# Patient Record
Sex: Male | Born: 1970
Health system: Southern US, Community
[De-identification: ages and names within clinical notes are randomized; demographics above are authoritative.]

## PROBLEM LIST (undated history)

## (undated) DIAGNOSIS — K219 Gastro-esophageal reflux disease without esophagitis: Secondary | ICD-10-CM

## (undated) DIAGNOSIS — K222 Esophageal obstruction: Secondary | ICD-10-CM

## (undated) DIAGNOSIS — G8929 Other chronic pain: Secondary | ICD-10-CM

## (undated) DIAGNOSIS — G43909 Migraine, unspecified, not intractable, without status migrainosus: Secondary | ICD-10-CM

## (undated) DIAGNOSIS — E785 Hyperlipidemia, unspecified: Secondary | ICD-10-CM

## (undated) DIAGNOSIS — M542 Cervicalgia: Secondary | ICD-10-CM

## (undated) DIAGNOSIS — K315 Obstruction of duodenum: Secondary | ICD-10-CM

## (undated) DIAGNOSIS — IMO0002 Reserved for concepts with insufficient information to code with codable children: Secondary | ICD-10-CM

## (undated) DIAGNOSIS — J40 Bronchitis, not specified as acute or chronic: Secondary | ICD-10-CM

## (undated) HISTORY — DX: Obstruction of duodenum: K31.5

## (undated) HISTORY — PX: ESOPHAGOGASTRODUODENOSCOPY: SHX1529

## (undated) HISTORY — DX: Cervicalgia: M54.2

## (undated) HISTORY — DX: Esophageal obstruction: K22.2

## (undated) HISTORY — DX: Gastro-esophageal reflux disease without esophagitis: K21.9

## (undated) HISTORY — DX: Bronchitis, not specified as acute or chronic: J40

## (undated) HISTORY — DX: Hyperlipidemia, unspecified: E78.5

## (undated) HISTORY — DX: Reserved for concepts with insufficient information to code with codable children: IMO0002

## (undated) HISTORY — DX: Other chronic pain: G89.29

## (undated) HISTORY — DX: Migraine, unspecified, not intractable, without status migrainosus: G43.909

---

## 2009-12-21 DIAGNOSIS — K222 Esophageal obstruction: Secondary | ICD-10-CM

## 2009-12-21 DIAGNOSIS — K315 Obstruction of duodenum: Secondary | ICD-10-CM

## 2009-12-21 HISTORY — DX: Obstruction of duodenum: K31.5

## 2009-12-21 HISTORY — DX: Esophageal obstruction: K22.2

## 2010-06-20 DIAGNOSIS — IMO0002 Reserved for concepts with insufficient information to code with codable children: Secondary | ICD-10-CM

## 2010-06-20 HISTORY — DX: Reserved for concepts with insufficient information to code with codable children: IMO0002

## 2010-07-16 ENCOUNTER — Ambulatory Visit (HOSPITAL_COMMUNITY): Admission: EM | Admit: 2010-07-16 | Discharge: 2010-07-16 | Payer: Self-pay | Admitting: Emergency Medicine

## 2010-07-16 ENCOUNTER — Ambulatory Visit: Payer: Self-pay | Admitting: Gastroenterology

## 2010-12-07 ENCOUNTER — Inpatient Hospital Stay (HOSPITAL_COMMUNITY)
Admission: EM | Admit: 2010-12-07 | Discharge: 2010-12-09 | Disposition: A | Discharge status: Still In Hospital | Payer: Self-pay | Source: Home / Self Care | Attending: Internal Medicine | Admitting: Internal Medicine

## 2010-12-07 ENCOUNTER — Encounter (INDEPENDENT_AMBULATORY_CARE_PROVIDER_SITE_OTHER): Payer: Self-pay | Admitting: *Deleted

## 2010-12-08 ENCOUNTER — Encounter: Payer: Self-pay | Admitting: Gastroenterology

## 2010-12-08 ENCOUNTER — Encounter (INDEPENDENT_AMBULATORY_CARE_PROVIDER_SITE_OTHER): Payer: Self-pay | Admitting: *Deleted

## 2010-12-09 ENCOUNTER — Encounter: Payer: Self-pay | Admitting: Gastroenterology

## 2010-12-09 ENCOUNTER — Encounter (INDEPENDENT_AMBULATORY_CARE_PROVIDER_SITE_OTHER): Payer: Self-pay | Admitting: *Deleted

## 2011-01-20 ENCOUNTER — Ambulatory Visit
Admission: RE | Admit: 2011-01-20 | Discharge: 2011-01-20 | Payer: Self-pay | Source: Home / Self Care | Attending: Gastroenterology | Admitting: Gastroenterology

## 2011-01-20 DIAGNOSIS — D62 Acute posthemorrhagic anemia: Secondary | ICD-10-CM | POA: Insufficient documentation

## 2011-01-20 DIAGNOSIS — K269 Duodenal ulcer, unspecified as acute or chronic, without hemorrhage or perforation: Secondary | ICD-10-CM | POA: Insufficient documentation

## 2011-01-20 DIAGNOSIS — K260 Acute duodenal ulcer with hemorrhage: Secondary | ICD-10-CM | POA: Insufficient documentation

## 2011-01-20 NOTE — Procedures (Addendum)
Summary: Upper Endoscopy  Patient: Juan Meyer Note: All result statuses are Final unless otherwise noted.  Tests: (1) Upper Endoscopy (EGD)   EGD Upper Endoscopy       DONE (C)     [facility]     [address]     [place], [place]  [address]           ENDOSCOPY PROCEDURE REPORT           PATIENT:  Juan Meyer, Juan Meyer  MR#:  [id number]     BIRTHDATE:  Nov 13, 1971, 39 yrs. old  GENDER:  male     ENDOSCOPIST:  Judie Petit T. Russella Dar, MD, [facility]     Referred by:  Linwood Dibbles, M.D.     PROCEDURE DATE:  07/16/2010     PROCEDURE:  EGD with foreign body removal, EGD with dilatation     over guidewire, EGD with biopsy     ASA CLASS:  Class I     INDICATIONS:  food impaction, dysphagia, chronic GERD, h/o ulcer     disease     MEDICATIONS:  Fentanyl 100 mcg IV, Versed 12 mg IV, Benadryl 50 mg     IV     TOPICAL ANESTHETIC:  Cetacaine Spray     DESCRIPTION OF PROCEDURE:   After the risks benefits and     alternatives of the procedure were thoroughly explained, informed     consent was obtained.  The EG-2990i ([id number]) endoscope was     introduced through the mouth and advanced to the second portion of     the duodenum, limited by Retained food in the stomach.   The     instrument was slowly withdrawn as the mucosa was fully examined.     <<PROCEDUREIMAGES>>     Retained food was present in the distal esophagus. The food was     removed without difficulty.  A stricture was found at the     gastroesophageal junction. It was circumferential and benign     appearing. It was 14 mm in diameter. Savary / guidewire 15 mm,     16mm and 17mm with minimal resistance to each dilator and no heme     noted.  Moderate gastritis was found in the antrum. It was erosive     and erythematous. A biopsy for H. pylori was taken.  An ulcer was     found in the bulb of the duodenum. It was clean based and friable.     It was 7 mm in size.  A stricture was found in the bulb of the     duodenum. It was 11 mm  in diameter. Retained food in the gastric     body.  Otherwise the examination was normal. Retroflexed views     revealed no abnormalities.  The scope was then withdrawn from the     patient and the procedure completed.     COMPLICATIONS:  None     ENDOSCOPIC IMPRESSION:     1) Food, retained in the distal esophagus     2) 14 mm stricture at the gastroesophageal junction     3) Moderate gastritis in the antrum     4) 7 mm ulcer in the bulb of duodenum     5) 11 mm stricture in the bulb of duodenum     RECOMMENDATIONS:     1) Anti-reflux regimen     2) PPI bid: Prilosec 20mg  OTC one po bid taken 30 minutes  before     breakfast and dinner     3) Await biopsy results     4) Post dilation instructions, DC Goody powders and all     ASA/NSAID products     5) Call office next 2-3 days to schedule an office appointment     for 4-6 week           Ardra Kuznicki T. Russella Dar, MD, [facility]           n.     REVISED:  07/16/2010 10:44 PM     eSIGNED:   Venita Lick. Narcisa Ganesh at 07/16/2010 10:44 PM           [name], [id number]  Note: An exclamation mark (!) indicates a result that was not dispersed into the flowsheet. Document Creation Date: 07/17/2010 9:12 AM _______________________________________________________________________  (1) Order result status: Final Collection or observation date-time: 07/16/2010 22:12 Requested date-time:  Receipt date-time:  Reported date-time:  Referring Physician:   Ordering Physician: Claudette Head [phone]) Specimen Source:  Source: Launa Grill Order Number: [id number] Lab site:   Appended Document: Upper Endoscopy L-Clotest (H. pylori), Biopsy - STATUS: Final                                            Perform Date: 27Jul11 22:31  Ordered By: Aggie Hacker,             Ordered Date: 27Jul11 22:36                                       Last Updated Date: 29Jul11 06:50  Facility: [facility]                              Department: MICR  Accession #:  [id number]                  USN:       [id number]  Findings  Result Name                              Result     Abnl   Normal Range     Units      Perf. Loc.  Helicobacter Screen                      SEE NOTE.         URENEG    UREASE NEGATIVE    CLOTEST DETECTS THE UREASE    ENZYME OF HELICOBACTER    PYLORI IN GASTRIC MUCOSAL    BIOPSIES.  Additional Information  HL7 RESULT STATUS : F  External IF Update Timestamp : 2010-07-18:06:46:00.000000

## 2011-01-22 NOTE — Discharge Summary (Signed)
Summary: Upper gastrointestinal bleed     NAME:  Juan Meyer, Juan Meyer                  ACCOUNT NO.:  [id number]      MEDICAL RECORD NO.:  [id number]          PATIENT TYPE:  INP      LOCATION:  4706                         FACILITY:  [facility]      PHYSICIAN:  Rock Nephew, MD       DATE OF BIRTH:  Mar 30, 1971      DATE OF ADMISSION:  12/07/2010   DATE OF DISCHARGE:  12/09/2010                                  DISCHARGE SUMMARY         PRIMARY CARE PHYSICIAN:  The patient does not have primary care   physician but he is going to establish care with [facility], Dr.   Deloria Lair.      DISCHARGE DIAGNOSES:   1. Upper gastrointestinal bleed secondary to clean based duodenal bulb       ulcer.   2. Hypotension, resolved.   3. Leukocytosis stress reaction.   4. Acute blood loss anemia.      DISCHARGE MEDICATIONS:  For the patient are as follows:   1. Iron 325 mg p.o. daily for 30 days.   2. Omeprazole 20 mg p.o. twice daily.      PROCEDURES PERFORMED:  The patient had an upper endoscopy performed   which showed a clean based ulcer in the bulb of the duodenum.  There   were no biopsies taken.      CONSULTANTS:  Dr. Arlyce Dice, [facility].      DIET:  Regular diet.      FOLLOWUP:  The patient should follow up with, the patient has an   appointment with [facility] PCP Dr. Deloria Lair to follow up December 19, 2010.  The patient also has an appointment to follow up with Dr. Claudette Head on January 31 at 10:00 a.m.  The patient is also instructed to   avoid NSAIDs.      INITIAL HISTORY AND PHYSICAL:  Chief complaint:  Black stools.  Mr. Shimel   is a pleasant 40 year old Caucasian male with a history of stomach ulcer   since he was 40 years old according to him.  The patient had upper   endoscopy in June of 2011 and a piece of meat was stuck in his   esophagus.  He was brought in for dark stools and he was slightly   hypertensive.      Hospital Course:      1. Upper GI bleed.  The patient had  upper GI bleed secondary to       duodenal bulb ulcer.  The patient's hemoglobin remained stable.       Hemoglobin initially was 10.9.  The patient's hemoglobin currently       is 10.0.  The patient did not receive any blood.  He had upper       endoscopy.  He was seen by [facility] and the patient is deemed       ready for discharge on December 09, 2010.  He will be discharged  home on omeprazole 20 mg p.o. b.i.d.   2. Hypotension.  The patient had some episodes of hypotension.  It       could have been related to volume depletion and blood loss.       However, the patient's blood pressure has been reasonably well-       controlled and the patient has       not been tachycardic.   3. Leukocytosis.  The patient had some leukocytosis.  Initially this       was probably a stress reaction with the upper GI bleed.  The       leukocytosis has since resolved.               Rock Nephew, MD               NH/MEDQ  D:  12/09/2010  T:  12/09/2010  Job:  [id number]      Electronically Signed by Rock Nephew MD on 12/22/2010 04:50:33 PM

## 2011-01-22 NOTE — Procedures (Signed)
Summary: Upper Endoscopy  Patient: Juan Meyer Note: All result statuses are Final unless otherwise noted.  Tests: (1) Upper Endoscopy (EGD)   EGD Upper Endoscopy       DONE     [facility]     [address]     [place], [place]  [address]           ENDOSCOPY PROCEDURE REPORT           PATIENT:  Juan Meyer, Juan Meyer  MR#:  [id number]     BIRTHDATE:  1971/10/14, 39 yrs. old  GENDER:  male           ENDOSCOPIST:  Barbette Hair. Arlyce Dice, MD     Referred by:           PROCEDURE DATE:  12/08/2010     PROCEDURE:  EGD, diagnostic 43235     ASA CLASS:  Class I     INDICATIONS:  melena           MEDICATIONS:   Fentanyl 75 mcg, Versed 7.5 mg, glycopyrrolate     (Robinal) 0.2 mg IV     TOPICAL ANESTHETIC:  Cetacaine Spray           DESCRIPTION OF PROCEDURE:   After the risks benefits and     alternatives of the procedure were thoroughly explained, informed     consent was obtained.  The EG-2990i ([id number]) endoscope was     introduced through the mouth and advanced to the third portion of     the duodenum, without limitations.  The instrument was slowly     withdrawn as the mucosa was fully examined.     <<PROCEDUREIMAGES>>           An ulcer was found in the bulb of the duodenum. 1cm ulcer at apex     of bulb with 2 red spots at base. No active bleeding (see image001     and image002).  Otherwise the examination was normal.     Retroflexed views revealed no abnormalities.    The scope was then     withdrawn from the patient and the procedure completed.           COMPLICATIONS:  None           ENDOSCOPIC IMPRESSION:     1) Ulcer in the bulb of duodenum     2) Otherwise normal examination     RECOMMENDATIONS:           1) continue PPI     2) AVOID NSAIDS           REPEAT EXAM:  No           ______________________________     Barbette Hair. Arlyce Dice, MD           CC:  Claudette Head, MD           n.     Rosalie Doctor:   Barbette Hair. Kaplan at 12/08/2010 02:53 PM           [name], [id number]  Note: An exclamation mark (!) indicates a result that was not dispersed into the flowsheet. Document Creation Date: 12/08/2010 2:54 PM _______________________________________________________________________  (1) Order result status: Final Collection or observation date-time: 12/08/2010 14:48 Requested date-time:  Receipt date-time:  Reported date-time:  Referring Physician:   Ordering Physician: Melvia Heaps [phone]) Specimen Source:  Source: Launa Grill Order Number: [id number] Lab site:

## 2011-01-22 NOTE — Letter (Signed)
Summary: New Patient letter  [facility]  [address] [place], [place] [address]   Phone: [phone]  Fax: [phone]       12/09/2010 MRN: [id number]  [facility] [address] [place], [place]  [address]  [place]  Dear Juan Meyer,  Welcome to the Gastroenterology Division at [facility].    You are scheduled to see Dr.  Russella Dar on 01-20-11 at 10am on the 3rd floor at [facility], [address]  We ask that you try to arrive at our office 15 minutes prior to your appointment time to allow for check-in.  We would like you to complete the enclosed self-administered evaluation form prior to your visit and bring it with you on the day of your appointment.  We will review it with you.  Also, please bring a complete list of all your medications or, if you prefer, bring the medication bottles and we will list them.  Please bring your insurance card so that we may make a copy of it.  If your insurance requires a referral to see a specialist, please bring your referral form from your primary care physician.  Co-payments are due at the time of your visit and may be paid by cash, check or credit card.     Your office visit will consist of a consult with your physician (includes a physical exam), any laboratory testing he/she may order, scheduling of any necessary diagnostic testing (e.g. x-ray, ultrasound, CT-scan), and scheduling of a procedure (e.g. Endoscopy, Colonoscopy) if required.  Please allow enough time on your schedule to allow for any/all of these possibilities.    If you cannot keep your appointment, please call [phone] to cancel or reschedule prior to your appointment date.  This allows [place] the opportunity to schedule an appointment for another patient in need of care.  If you do not cancel or reschedule by 5 p.m. the business day prior to your appointment date, you will be charged a $50.00 late cancellation/no-show fee.    Thank you for choosing  [facility] for your medical needs.  We appreciate the opportunity to care for you.  Please visit [place] at our website  to learn more about our practice.                     Sincerely,                                                             The Gastroenterology Division

## 2011-01-28 NOTE — Assessment & Plan Note (Signed)
Summary: RECURRENT DUODENAL ULCERS & GI BLEED/YF   History of Present Illness Visit Type: follow up Primary GI MD: Elie Goody MD [facility] Primary Provider: n/a Requesting Provider: n/a Chief Complaint: Patient here for f/u after hospitalization for duodenal ulcer. Patient states that he is doing better and denies any current GI symptoms. History of Present Illness:   Juan Meyer was hospitalized with bleeding from a duodenal ulcer in December. He was advised to stop all anti-inflammatory medications, which he has done. He has no gastrointestinal complaints today. Discharge hemoglobin was 10. Serum gastrin was normal. I have reviewed the discharge summary including results from his hospitalization. He states he was seen [facility] for ongoing primary care.    GI Review of Systems    Reports acid reflux.      Denies abdominal pain, belching, bloating, chest pain, dysphagia with liquids, dysphagia with solids, heartburn, loss of appetite, nausea, vomiting, vomiting blood, weight loss, and  weight gain.        Denies anal fissure, black tarry stools, change in bowel habit, constipation, diarrhea, diverticulosis, fecal incontinence, heme positive stool, hemorrhoids, irritable bowel syndrome, jaundice, light color stool, liver problems, rectal bleeding, and  rectal pain.   Current Medications (verified): 1)  Omeprazole 20 Mg Cpdr (Omeprazole) .... One Tablet By Mouth Two Times A Day 2)  Ferrous Sulfate 325 (65 Fe) Mg Tbec (Ferrous Sulfate) .... Take 1 Tablet By Mouth Two Daily  Allergies (verified): No Known Drug Allergies  Past History:  Past Medical History: Reviewed history from 01/15/2011 and no changes required. 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  Alcohol Use - no Illicit Drug Use - no Patient gets regular  exercise. Daily Caffeine Use-6 cups daily  Review of Systems       The pertinent positives and negatives are noted as above and in the HPI. All other ROS were reviewed and were negative.   Vital Signs:  Patient profile:   40 year old male Height:      68 inches Weight:      172 pounds BMI:     26.25 BSA:     1.92 Pulse rate:   76 / minute Pulse rhythm:   regular BP sitting:   120 / 80  (left arm)  Vitals Entered By: Lamona Curl CMA Duncan Dull) (January 20, 2011 9:55 AM)  Physical Exam  General:  Well developed, well nourished, no acute distress. Head:  Normocephalic and atraumatic. Eyes:  PERRLA, no icterus. Mouth:  No deformity or lesions, dentition normal. Lungs:  Clear throughout to auscultation. Heart:  Regular rate and rhythm; no murmurs, rubs,  or bruits. Abdomen:  Soft, nontender and nondistended. No masses, hepatosplenomegaly or hernias noted. Normal bowel sounds. Psych:  Alert and cooperative. Normal mood and affect.  Impression & Recommendations:  Problem # 1:  DUODENAL ULCER, ACUTE, HEMORRHAGE (ICD-532.00) He is advised to avoid aspirin and anti-inflammatory products long term. He is advised not to use tobacco products. Decrease omeprazole 20 mg daily for long term ulcer prophylaxis.  Problem # 2:  ANEMIA, SECONDARY TO ACUTE BLOOD LOSS (ICD-285.1) Continue iron replacement for 2 more months, and then he may discontinued. Further followup is anemia per his PCP at [facility].  Problem # 3:  SCREENING COLORECTAL-CANCER (ICD-V76.51) Average risk for colorectal cancer. Routine colonoscopy at age 59.  Patient Instructions: 1)  Decrease omeprazole  to one tablet by mouth once daily. 2)  Stay on Iron one tablet by mouth two times a day x 2 months then stop.  3)  Please schedule a follow-up appointment as needed.  4)  The medication list was reviewed and reconciled.  All changed / newly prescribed medications were explained.  A complete medication list was provided  to the patient / caregiver. 5)  Copy sent to : [facility]

## 2011-03-02 LAB — CBC
HCT: 29.2 % — ABNORMAL LOW (ref 39.0–52.0)
HCT: 31.8 % — ABNORMAL LOW (ref 39.0–52.0)
Hemoglobin: 10 g/dL — ABNORMAL LOW (ref 13.0–17.0)
Hemoglobin: 10.2 g/dL — ABNORMAL LOW (ref 13.0–17.0)
Hemoglobin: 10.6 g/dL — ABNORMAL LOW (ref 13.0–17.0)
Hemoglobin: 13 g/dL (ref 13.0–17.0)
MCH: 29.3 pg (ref 26.0–34.0)
MCH: 29.9 pg (ref 26.0–34.0)
MCH: 30.2 pg (ref 26.0–34.0)
MCHC: 33.3 g/dL (ref 30.0–36.0)
MCHC: 34.2 g/dL (ref 30.0–36.0)
MCV: 88.2 fL (ref 78.0–100.0)
RBC: 3.41 MIL/uL — ABNORMAL LOW (ref 4.22–5.81)
RBC: 3.58 MIL/uL — ABNORMAL LOW (ref 4.22–5.81)
RBC: 4.44 MIL/uL (ref 4.22–5.81)

## 2011-03-02 LAB — BASIC METABOLIC PANEL
CO2: 25 mEq/L (ref 19–32)
CO2: 27 mEq/L (ref 19–32)
Calcium: 8 mg/dL — ABNORMAL LOW (ref 8.4–10.5)
Calcium: 8.4 mg/dL (ref 8.4–10.5)
Chloride: 108 mEq/L (ref 96–112)
Chloride: 109 mEq/L (ref 96–112)
GFR calc Af Amer: >60 mL/min (ref >60)
Glucose, Bld: 92 mg/dL (ref 70–99)
Potassium: 3.9 mEq/L (ref 3.5–5.1)
Sodium: 140 mEq/L (ref 135–145)
Sodium: 140 mEq/L (ref 135–145)

## 2011-03-02 LAB — COMPREHENSIVE METABOLIC PANEL
ALT: 17 U/L (ref 0–53)
AST: 20 U/L (ref 0–37)
Alkaline Phosphatase: 73 U/L (ref 39–117)
CO2: 23 mEq/L (ref 19–32)
Chloride: 108 mEq/L (ref 96–112)
GFR calc Af Amer: >60 mL/min (ref >60)
GFR calc non Af Amer: >60 mL/min (ref >60)
Sodium: 137 mEq/L (ref 135–145)
Total Bilirubin: 0.8 mg/dL (ref 0.3–1.2)

## 2011-03-02 LAB — DIFFERENTIAL
Basophils Absolute: 0.1 10*3/uL (ref 0.0–0.1)
Eosinophils Absolute: 0.3 10*3/uL (ref 0.0–0.7)
Eosinophils Relative: 2 % (ref 0–5)

## 2011-03-02 LAB — ABO/RH: ABO/RH(D): O POS

## 2011-03-02 LAB — GASTRIN: Gastrin: 26 pg/mL (ref <101)

## 2011-03-02 LAB — HEMOCCULT GUIAC POC 1CARD (OFFICE): Fecal Occult Bld: POSITIVE

## 2011-03-02 LAB — LIPASE, BLOOD: Lipase: 22 U/L (ref 11–59)

## 2011-03-02 LAB — LACTIC ACID, PLASMA: Lactic Acid, Venous: 2.5 mmol/L — ABNORMAL HIGH (ref 0.5–2.2)

## 2011-03-02 LAB — AMYLASE: Amylase: 55 U/L (ref 0–105)

## 2011-04-24 ENCOUNTER — Emergency Department (HOSPITAL_COMMUNITY)
Admission: EM | Admit: 2011-04-24 | Discharge: 2011-04-24 | Disposition: A | Discharge status: Home / Self Care | Payer: Self-pay | Attending: Emergency Medicine | Admitting: Emergency Medicine

## 2011-04-24 DIAGNOSIS — R5383 Other fatigue: Secondary | ICD-10-CM | POA: Insufficient documentation

## 2011-04-24 DIAGNOSIS — K219 Gastro-esophageal reflux disease without esophagitis: Secondary | ICD-10-CM | POA: Insufficient documentation

## 2011-04-24 DIAGNOSIS — K259 Gastric ulcer, unspecified as acute or chronic, without hemorrhage or perforation: Secondary | ICD-10-CM | POA: Insufficient documentation

## 2011-04-24 DIAGNOSIS — R5381 Other malaise: Secondary | ICD-10-CM | POA: Insufficient documentation

## 2011-04-24 DIAGNOSIS — R0602 Shortness of breath: Secondary | ICD-10-CM | POA: Insufficient documentation

## 2011-04-24 LAB — POCT I-STAT, CHEM 8
BUN: 9 mg/dL (ref 6–23)
Calcium, Ion: 1.08 mmol/L — ABNORMAL LOW (ref 1.12–1.32)
Chloride: 104 mEq/L (ref 96–112)
Creatinine, Ser: 1 mg/dL (ref 0.4–1.5)
Glucose, Bld: 96 mg/dL (ref 70–99)
Potassium: 3.6 mEq/L (ref 3.5–5.1)

## 2011-07-04 ENCOUNTER — Emergency Department (HOSPITAL_COMMUNITY): Payer: Self-pay

## 2011-07-04 ENCOUNTER — Emergency Department (HOSPITAL_COMMUNITY)
Admission: EM | Admit: 2011-07-04 | Discharge: 2011-07-04 | Disposition: A | Discharge status: Home / Self Care | Payer: Self-pay | Attending: Emergency Medicine | Admitting: Emergency Medicine

## 2011-07-04 DIAGNOSIS — R42 Dizziness and giddiness: Secondary | ICD-10-CM | POA: Insufficient documentation

## 2011-07-04 DIAGNOSIS — Z8711 Personal history of peptic ulcer disease: Secondary | ICD-10-CM | POA: Insufficient documentation

## 2011-07-04 DIAGNOSIS — M5412 Radiculopathy, cervical region: Secondary | ICD-10-CM | POA: Insufficient documentation

## 2011-07-04 DIAGNOSIS — E785 Hyperlipidemia, unspecified: Secondary | ICD-10-CM | POA: Insufficient documentation

## 2011-07-04 DIAGNOSIS — M79609 Pain in unspecified limb: Secondary | ICD-10-CM | POA: Insufficient documentation

## 2011-07-04 DIAGNOSIS — K219 Gastro-esophageal reflux disease without esophagitis: Secondary | ICD-10-CM | POA: Insufficient documentation

## 2011-07-04 DIAGNOSIS — R209 Unspecified disturbances of skin sensation: Secondary | ICD-10-CM | POA: Insufficient documentation

## 2011-07-04 DIAGNOSIS — M4802 Spinal stenosis, cervical region: Secondary | ICD-10-CM | POA: Insufficient documentation

## 2011-07-04 DIAGNOSIS — R002 Palpitations: Secondary | ICD-10-CM | POA: Insufficient documentation

## 2011-07-04 DIAGNOSIS — M5030 Other cervical disc degeneration, unspecified cervical region: Secondary | ICD-10-CM | POA: Insufficient documentation

## 2011-07-04 LAB — POCT I-STAT, CHEM 8
BUN: 10 mg/dL (ref 6–23)
Calcium, Ion: 1.09 mmol/L — ABNORMAL LOW (ref 1.12–1.32)
Chloride: 102 mEq/L (ref 96–112)
Creatinine, Ser: 1 mg/dL (ref 0.50–1.35)
Glucose, Bld: 87 mg/dL (ref 70–99)
Potassium: 3.6 mEq/L (ref 3.5–5.1)

## 2011-07-04 MED ORDER — IOHEXOL 300 MG/ML  SOLN
60.0000 mL | Freq: Once | INTRAMUSCULAR | Status: AC | PRN
  Administered 2011-07-04: 60 mL via INTRAVENOUS

## 2011-11-24 ENCOUNTER — Ambulatory Visit (INDEPENDENT_AMBULATORY_CARE_PROVIDER_SITE_OTHER): Payer: BC Managed Care – PPO | Admitting: Family Medicine

## 2011-11-24 ENCOUNTER — Encounter: Payer: Self-pay | Admitting: Family Medicine

## 2011-11-24 ENCOUNTER — Ambulatory Visit: Payer: Self-pay | Admitting: Family Medicine

## 2011-11-24 DIAGNOSIS — J310 Chronic rhinitis: Secondary | ICD-10-CM

## 2011-11-24 DIAGNOSIS — J3489 Other specified disorders of nose and nasal sinuses: Secondary | ICD-10-CM

## 2011-11-24 DIAGNOSIS — E785 Hyperlipidemia, unspecified: Secondary | ICD-10-CM | POA: Insufficient documentation

## 2011-11-24 DIAGNOSIS — J329 Chronic sinusitis, unspecified: Secondary | ICD-10-CM | POA: Insufficient documentation

## 2011-11-24 DIAGNOSIS — Z23 Encounter for immunization: Secondary | ICD-10-CM

## 2011-11-24 DIAGNOSIS — R5383 Other fatigue: Secondary | ICD-10-CM

## 2011-11-24 MED ORDER — PREDNISONE 20 MG PO TABS: ORAL_TABLET | ORAL | Status: DC

## 2011-11-24 MED ORDER — MOMETASONE FUROATE 50 MCG/ACT NA SUSP: NASAL | Status: DC

## 2011-11-24 MED ORDER — FEXOFENADINE HCL 180 MG PO TABS: 180.0000 mg | ORAL_TABLET | Freq: Every day | ORAL | Status: DC

## 2011-11-24 MED ORDER — AMOXICILLIN-POT CLAVULANATE 875-125 MG PO TABS: 1.0000 | ORAL_TABLET | Freq: Two times a day (BID) | ORAL | Status: AC

## 2011-11-24 NOTE — Assessment & Plan Note (Signed)
Counseled pt on this. Recommended he stop AFRIN now.

## 2011-11-24 NOTE — Assessment & Plan Note (Signed)
Unclear distant past history.  Needs repeating.  Set lab appt for fasting lipid panel.

## 2011-11-24 NOTE — Patient Instructions (Signed)
Stop Afrin nasal spray.

## 2011-11-24 NOTE — Assessment & Plan Note (Signed)
Likely secondary to chronic poor sleep with his nasal congestion lately. However, given history of upper GI bleed and hx of hyperlipidemia, I will have him get fasting CMET, CBC, and TSH.

## 2011-11-24 NOTE — Assessment & Plan Note (Signed)
Infectious + allergic components likely, plus rhinitis medicamentosa is adding to his discomfort. Augmentin 875 bid x 10d.  Prednisone 40mg  qd x 5d, then 20mg  qd x5d. Start nasonex qd and allegra 180mg  qd.   I'll see him back in 2 wks and I'm hoping for at least 50% improvement. In the meantime, he'll come in for fasting labs, which will include RAST full allergy profile.

## 2011-11-24 NOTE — Progress Notes (Signed)
Office Note 11/26/2011  CC:  Chief Complaint  Patient presents with  . Establish Care    congestion    HPI:  Juan Meyer is a 40 y.o. White male who is here to establish care and discuss respiratory complaints. Patient's most recent primary MD: none.   Old records in [facility] EMR (GI and ED) were reviewed prior to or during today's visit.  Reports 3-4 month hx of persistent nasal congestion, feeling full in sinuses/head, frontal/retroorbital headaches, USING AFRIN NASAL SPRAY DAILY FOR SEVERAL MONTHS!  No cough, no wheezing or SOB, no chest tightness.  Works in dusty environment. Used flonase daily for 2 wks and got no relief so stopped it.    He expresses some concern about overall health, says that he really wants blood screening for diabetes, cholesterol, etc. He is not fasting today.  He also mentions chronic neck pain since a fall off of a scaffold in distant past, recently got rx'd percocet by oral surgeon after some dental work and says this totally took his neck pain away.  He asks for more of this med until he can get in to see the chiropracter.  Past Medical History  Diagnosis Date  . Ulcer 06/2010    NSAID-induced.  Duodenal bulb.  +GI bleed.  . Esophageal stricture 2011  . Duodenal stricture 2011  . Chronic neck pain     s/p falling off scaffold 1997.  This is better now.  [name] GERD (gastroesophageal reflux disease)   . Hyperlipidemia     History reviewed. No pertinent past surgical history.  History reviewed. No pertinent family history. Negative for colon cancer, prostate cancer, lung cancer, breast cancer.  Neg for DM or CAD.  History   Social History  . Marital Status: Married    Spouse Name: [name]    Number of Children: N/A  . Years of Education: N/A   Occupational History  . Maintenance Plant Mgr    Social History Main Topics  . Smoking status: Never Smoker   . Smokeless tobacco: Never Used  . Alcohol Use: No  . Drug Use: No  . Sexually Active: Not  on file   Other Topics Concern  . 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  No T/A/Ds.  Lots of dust exposure at work but no other potential lung irritants/carcinogens.Exercised 5d/week up until 6 mo ago.   MEDS CURRENTLY: omeprazole 20mg  bid, Afrin daily x months  Outpatient Encounter Prescriptions as of 11/24/2011  Medication Sig Dispense Refill  . omeprazole (PRILOSEC) 20 MG capsule Take 20 mg by mouth 2 (two) times daily.        [name] DISCONTD: oxymetazoline (AFRIN) 0.05 % nasal spray Place 2 sprays into the nose 2 (two) times daily.       [name] amoxicillin-clavulanate (AUGMENTIN) 875-125 MG per tablet Take 1 tablet by mouth 2 (two) times daily.  20 tablet  0  . fexofenadine (ALLEGRA) 180 MG tablet Take 1 tablet (180 mg total) by mouth daily.  30 tablet  6  . mometasone (NASONEX) 50 MCG/ACT nasal spray 2 sprays in each nostril once each morning  17 g  6  . predniSONE (DELTASONE) 20 MG tablet 2 tabs po qd x 5d, then 1 tab po qd x 5d  15 tablet  0    No Known Allergies  ROS Review of Systems  Constitutional: Positive for fatigue. Negative for fever, chills, diaphoresis and  appetite change.  HENT: Negative for ear pain, congestion, sore throat, neck stiffness and dental problem.        See HPI   Eyes: Negative for discharge, redness and visual disturbance.  Respiratory: Negative for cough, chest tightness, shortness of breath and wheezing.        See HPI  Cardiovascular: Negative for chest pain, palpitations and leg swelling.  Gastrointestinal: Negative for nausea, vomiting, abdominal pain, diarrhea and blood in stool.  Genitourinary: Negative for dysuria, urgency, frequency, hematuria, flank pain and difficulty urinating.  Musculoskeletal: Negative for myalgias, back pain, joint swelling and arthralgias.  Skin: Negative for pallor and rash.  Neurological: Positive for headaches. Negative for dizziness,  speech difficulty and weakness.  Hematological: Negative for adenopathy. Does not bruise/bleed easily.  Psychiatric/Behavioral: Positive for sleep disturbance (due to nasal congestion). Negative for confusion. The patient is not nervous/anxious.     PE; Blood pressure 133/88, pulse 93, temperature 97.8 F (36.6 C), temperature source Oral, weight 178 lb (80.74 kg), SpO2 99.00%. Gen: Alert, well appearing.  Patient is oriented to person, place, time, and situation. ENT: Ears: EACs clear, normal epithelium.  TMs with good light reflex and landmarks bilaterally.  Eyes: no injection, icteris, swelling, or exudate.  EOMI, PERRLA. Nose: both nasal passages show very swollen and mildly injected turbinates. NO purulent d/c.   Mouth: lips without lesion/swelling.  Oral mucosa pink and moist.  Dentition intact and without obvious caries or gingival swelling.  Oropharynx without erythema, exudate, or swelling.  Mild discomfort with palpation of paranasal sinuses.   Neck - No masses or thyromegaly or limitation in range of motion RRR, without murmur, rub, or gallop. Carotid pulses easily palpable, symmetric pulsations, no bruit or palpable dilatation. Radial, femoral, and ankle pulses easily palpable and symmetric. No abdominal bruit. No varicosities.  Cap refill in extremities is brisk. Chest with symmetric expansion, good aeration, nonlabored respirations.  Clear and equal breath sounds in all lung fields.  No clubbing or cyanosis. ABD: soft, NT, ND, BS normal.  No hepatospenomegaly or mass.  No bruits. EXT: no clubbing, cyanosis, or edema.   Pertinent labs:  None today  ASSESSMENT AND PLAN:   New pt: no old records to obtain.  Chronic sinusitis Infectious + allergic components likely, plus rhinitis medicamentosa is adding to his discomfort. Augmentin 875 bid x 10d.  Prednisone 40mg  qd x 5d, then 20mg  qd x5d. Start nasonex qd and allegra 180mg  qd.   I'll see him back in 2 wks and I'm hoping  for at least 50% improvement. In the meantime, he'll come in for fasting labs, which will include RAST full allergy profile.  Rhinitis medicamentosa Counseled pt on this. Recommended he stop AFRIN now.  Hyperlipidemia Unclear distant past history.  Needs repeating.  Set lab appt for fasting lipid panel.  Fatigue Likely secondary to chronic poor sleep with his nasal congestion lately. However, given history of upper GI bleed and hx of hyperlipidemia, I will have him get fasting CMET, CBC, and TSH.   Neck pain: not evaluated extensively today, but I declined to give him narcotic pain med.   We can eval this at future visit.  He may be going to the chiropracter, as he mentioned.  Flu vaccine given IM today.  Return in about 2 weeks (around 12/08/2011) for f/u sinusitis.   Lab visit for FASTING blood work (orders entered already) at his convenience.

## 2011-11-25 ENCOUNTER — Other Ambulatory Visit: Payer: Self-pay | Admitting: Family Medicine

## 2011-11-25 ENCOUNTER — Telehealth: Payer: Self-pay | Admitting: *Deleted

## 2011-11-25 ENCOUNTER — Encounter: Payer: Self-pay | Admitting: *Deleted

## 2011-11-25 ENCOUNTER — Other Ambulatory Visit (INDEPENDENT_AMBULATORY_CARE_PROVIDER_SITE_OTHER): Payer: BC Managed Care – PPO

## 2011-11-25 DIAGNOSIS — J329 Chronic sinusitis, unspecified: Secondary | ICD-10-CM

## 2011-11-25 DIAGNOSIS — R5383 Other fatigue: Secondary | ICD-10-CM

## 2011-11-25 DIAGNOSIS — J3489 Other specified disorders of nose and nasal sinuses: Secondary | ICD-10-CM

## 2011-11-25 DIAGNOSIS — E785 Hyperlipidemia, unspecified: Secondary | ICD-10-CM

## 2011-11-25 DIAGNOSIS — J310 Chronic rhinitis: Secondary | ICD-10-CM

## 2011-11-25 DIAGNOSIS — R5381 Other malaise: Secondary | ICD-10-CM

## 2011-11-25 LAB — COMPREHENSIVE METABOLIC PANEL
ALT: 19 U/L (ref 0–53)
AST: 23 U/L (ref 0–37)
Albumin: 4.2 g/dL (ref 3.5–5.2)
CO2: 30 mEq/L (ref 19–32)
Calcium: 8.9 mg/dL (ref 8.4–10.5)
Chloride: 103 mEq/L (ref 96–112)
GFR: 77.74 mL/min (ref >60.00)
Potassium: 3.6 mEq/L (ref 3.5–5.1)
Sodium: 140 mEq/L (ref 135–145)
Total Protein: 6.9 g/dL (ref 6.0–8.3)

## 2011-11-25 LAB — CBC WITH DIFFERENTIAL/PLATELET
Basophils Absolute: 0 10*3/uL (ref 0.0–0.1)
Eosinophils Absolute: 0 10*3/uL (ref 0.0–0.7)
Hemoglobin: 17.1 g/dL — ABNORMAL HIGH (ref 13.0–17.0)
Lymphocytes Relative: 14.3 % (ref 12.0–46.0)
MCHC: 33.9 g/dL (ref 30.0–36.0)
Monocytes Relative: 6.6 % (ref 3.0–12.0)
Neutrophils Relative %: 78.1 % — ABNORMAL HIGH (ref 43.0–77.0)
RDW: 14.2 % (ref 11.5–14.6)

## 2011-11-25 LAB — LIPID PANEL
HDL: 35 mg/dL — ABNORMAL LOW (ref >39.00)
Total CHOL/HDL Ratio: 5

## 2011-11-25 LAB — TSH: TSH: 0.33 u[IU]/mL — ABNORMAL LOW (ref 0.35–5.50)

## 2011-11-25 NOTE — Telephone Encounter (Signed)
Pt requested work note while here to have labs drawn.  Note written for 11/24/11 and 11/25/11

## 2011-11-25 NOTE — Telephone Encounter (Signed)
Message copied by Luisa Dago on Wed Nov 25, 2011  9:20 AM ------      Message from: Juan Meyer      Created: Wed Nov 25, 2011  9:07 AM      Regarding: work excuse       Patient needs a work excuse for yesterday afternoon 11/24/11 and for this morning 11/25/11. Thanks

## 2011-11-26 ENCOUNTER — Encounter: Payer: Self-pay | Admitting: Family Medicine

## 2011-11-26 LAB — ALLERGY FULL PROFILE
Allergen,Goose feathers, e70: <0.1 kU/L (ref <0.35)
Alternaria Alternata: <0.1 kU/L (ref <0.35)
Bahia Grass: <0.1 kU/L (ref <0.35)
Box Elder IgE: <0.1 kU/L (ref <0.35)
Cat Dander: <0.1 kU/L (ref <0.35)
Common Ragweed: <0.1 kU/L (ref <0.35)
Curvularia lunata: <0.1 kU/L (ref <0.35)
Dog Dander: <0.1 kU/L (ref <0.35)
Fescue: <0.1 kU/L (ref <0.35)
G005 Rye, Perennial: <0.1 kU/L (ref <0.35)
Goldenrod: <0.1 kU/L (ref <0.35)
Helminthosporium halodes: <0.1 kU/L (ref <0.35)
House Dust Hollister: <0.1 kU/L (ref <0.35)
IgE (Immunoglobulin E), Serum: 11.8 IU/mL (ref 0.0–180.0)
Lamb's Quarters: <0.1 kU/L (ref <0.35)
Stemphylium Botryosum: <0.1 kU/L (ref <0.35)
Sycamore Tree: <0.1 kU/L (ref <0.35)

## 2011-11-26 LAB — T4, FREE: Free T4: 0.68 ng/dL (ref 0.60–1.60)

## 2011-11-26 LAB — T3: T3, Total: 104.8 ng/dL (ref 80.0–204.0)

## 2011-11-27 ENCOUNTER — Telehealth: Payer: Self-pay | Admitting: Family Medicine

## 2011-11-27 NOTE — Telephone Encounter (Signed)
I have rx'd him with [facility] and there is no change in med indicated (he is on an appropriate antibiotic to treat a chest infection if it is coming from a bacteria) and I have placed him on prednisone, which is the best medication available to treat severe bronchitis.  So, basically I could add hycodan cough syrup if he wants.  Otherwise, no new meds are indicated.  Any fever?  Any wheezing or shortness of breath? --PM

## 2011-11-27 NOTE — Telephone Encounter (Signed)
Patient states he feels worse, it's moved into his chest, can he get a different antibiotic?

## 2011-11-27 NOTE — Telephone Encounter (Signed)
RC to pt.  Number given [phone] belongs to [facility], [facility] [address].  No message left.  Per Diane pt has lost personal cell phone.  No answer at [phone] (h) and voice mail has not been set up.  No way to contact pt.   Is medication change appropriate?  [facility].

## 2011-11-27 NOTE — Telephone Encounter (Signed)
I tried to call pt at [phone] and there was no answer and the patients voicemail has not been set up yet?

## 2011-11-28 LAB — ALLERGEN FOOD PROFILE SPECIFIC IGE
Chicken IgE: <0.1 kU/L (ref <0.35)
Egg White IgE: <0.1 kU/L (ref <0.35)
Orange: <0.1 kU/L (ref <0.35)
Peanut IgE: <0.1 kU/L (ref <0.35)
Shrimp IgE: <0.1 kU/L (ref <0.35)
Tomato IgE: <0.1 kU/L (ref <0.35)

## 2011-11-30 ENCOUNTER — Other Ambulatory Visit: Payer: Self-pay | Admitting: Family Medicine

## 2011-11-30 MED ORDER — AZITHROMYCIN 250 MG PO TABS: ORAL_TABLET | ORAL | Status: DC

## 2011-11-30 NOTE — Telephone Encounter (Signed)
Patient wife called and said the new number is [phone].  Please call with results.

## 2011-11-30 NOTE — Telephone Encounter (Signed)
Pts wife called and states that Amoxicillin doesn't work for him. Pts wife would like a ZPAK called in to [facility] [address]? Please advise?

## 2011-11-30 NOTE — Telephone Encounter (Signed)
Sorry, disregard message about amoxil eRx.  Z-pack eRx'd--PM

## 2011-11-30 NOTE — Telephone Encounter (Signed)
Amoxil 875mg  bid eRx'd.

## 2011-12-01 NOTE — Telephone Encounter (Signed)
Pt informed and states he picked it up yesterday

## 2011-12-08 ENCOUNTER — Ambulatory Visit (INDEPENDENT_AMBULATORY_CARE_PROVIDER_SITE_OTHER): Payer: BC Managed Care – PPO | Admitting: Family Medicine

## 2011-12-08 ENCOUNTER — Encounter: Payer: Self-pay | Admitting: Family Medicine

## 2011-12-08 DIAGNOSIS — R5381 Other malaise: Secondary | ICD-10-CM

## 2011-12-08 DIAGNOSIS — F411 Generalized anxiety disorder: Secondary | ICD-10-CM

## 2011-12-08 DIAGNOSIS — R5383 Other fatigue: Secondary | ICD-10-CM

## 2011-12-08 DIAGNOSIS — E785 Hyperlipidemia, unspecified: Secondary | ICD-10-CM

## 2011-12-08 DIAGNOSIS — J309 Allergic rhinitis, unspecified: Secondary | ICD-10-CM | POA: Insufficient documentation

## 2011-12-08 MED ORDER — CLONAZEPAM 1 MG PO TABS: 1.0000 mg | ORAL_TABLET | Freq: Two times a day (BID) | ORAL | Status: DC | PRN

## 2011-12-08 MED ORDER — PAROXETINE HCL 20 MG PO TABS: 20.0000 mg | ORAL_TABLET | ORAL | Status: DC

## 2011-12-08 MED ORDER — MONTELUKAST SODIUM 10 MG PO TABS: 10.0000 mg | ORAL_TABLET | Freq: Every day | ORAL | Status: DC

## 2011-12-08 NOTE — Assessment & Plan Note (Signed)
Likely related to poor sleep secondary to chronic nasal congestion as of late, but with borderline low TSH recently I think it is reasonable to recheck thyroid panel with testosterone level in 9mo--he is agreeable with this plan.

## 2011-12-08 NOTE — Assessment & Plan Note (Signed)
Start paxil 20mg  qd.  Therapeutic expectations and side effect profile of medication discussed today.  Patient's questions answered. Start clonazepam 1mg  q12h prn, #30, RF x 1. Recheck in office in 3-4 wks.

## 2011-12-08 NOTE — Assessment & Plan Note (Signed)
Recent lipid panel not optimal but not too bad. Encouraged low fat/low chol diet, continue frequent cardio exercise. We'll recheck FLP in 1 yr.

## 2011-12-08 NOTE — Assessment & Plan Note (Addendum)
More than likely has "irritant" rhinitis more than allergies.   Also with recurrent sinusitis and pt-reported history of sinus polyps. Overall regarding this situation he is improved.  He wants to avoid surgery "at all costs" so he defers ENT referral or recheck of sinus CT at this time.  Will maximize medical mgmt. Continue allegra D, nasonex, nasal saline irrigation, and add singulair 10mg  qd today. Avoid OTC topical decongestant.  Reviewed allergy labs from last week: all normal.

## 2011-12-08 NOTE — Progress Notes (Signed)
OFFICE NOTE  12/08/2011  CC:  Chief Complaint  Patient presents with  . Follow-up    URI     HPI:   Patient is a 40 y.o. Caucasian male who is here for 2 week f/u sinusitis and rhinitis medicamentosa. Finished Z pack and steroid taper.  Says sinuses/nose feel much improved but not completely resolved. No persistent face/upper teeth pain, no persistent "sinus" headaches.  Minimal residual cough. NO wheezing, no ST, no fevers.  He gives additional PMH today: says he was told years ago when CT was done after he fell off of scaffolding that his sinuses had "huge" polyps in them.  We reviewed his recent lab work in detail: all normal lipid panel except mildly low HDL.  Would repeat in 1 yr. Discussed TSH minimally low, normal T4 and T3.  Likely insignificant but will repeat in 82mo. He reiterated today his problems with poor sleep quality, feels like he takes his job home, worries/stresses about everything, feels keyed up and irritable all the time, poor energy the last 3-6 mo (esp since his sinus problems have been worse and preventing exercise).  Denies outright depressed mood but admits he feels like he should be enjoying his life more.  No panic attacks.  Has never been on anxiety/depression med before. He doesn't smoke, drink, or use drugs.  Pertinent PMH:  Past Medical History  Diagnosis Date  . Ulcer 06/2010    NSAID-induced.  Duodenal bulb.  +GI bleed.  . Esophageal stricture 2011  . Duodenal stricture 2011  . Chronic neck pain     s/p falling off scaffold 1997.  This is better now.  [name] GERD (gastroesophageal reflux disease)   . Hyperlipidemia    History reviewed. No pertinent past surgical history.  Past family and social history reviewed and there are no changes since the patient's last office visit with me.  MEDS;  Allegra D, nasonex qd, omeprazole bid  Outpatient Prescriptions Prior to Visit  Medication Sig Dispense Refill  . fexofenadine (ALLEGRA) 180 MG tablet Take 1  tablet (180 mg total) by mouth daily.  30 tablet  6  . mometasone (NASONEX) 50 MCG/ACT nasal spray 2 sprays in each nostril once each morning  17 g  6  . omeprazole (PRILOSEC) 20 MG capsule Take 20 mg by mouth 2 (two) times daily.        [name] azithromycin (ZITHROMAX) 250 MG tablet 2 tabs po qd x 1d, then 1 tab po qd x 4d  6 each  0  . predniSONE (DELTASONE) 20 MG tablet 2 tabs po qd x 5d, then 1 tab po qd x 5d  15 tablet  0    PE: Blood pressure 129/83, pulse 84, weight 177 lb (80.287 kg), SpO2 99.00%. Gen: Alert, well appearing.  Patient is oriented to person, place, time, and situation.  Affect a bit flat but he converses pleasantly.  Lucid thought and conversation. ENT: right EAC with large brown cerumen ball filling 3/4 of the canal.  This was removed with curette today by myself.  TM normal. Left EAC and TM normal.  Nose: moderate turbinate edema, minimal mucosal injection, some clear rhinorrhea present.  No throat swelling or PND. Neck: no LAD or TM. CV: RRR, no m/r/g.   LUNGS: CTA bilat, nonlabored resps, good aeration in all lung fields.  Lab Results  Component Value Date   CHOL 169 11/25/2011   HDL 35.00* 11/25/2011   LDLCALC 120* 11/25/2011   TRIG 68.0 11/25/2011  CHOLHDL 5 11/25/2011   Lab Results  Component Value Date   WBC 7.7 11/25/2011   HGB 17.1* 11/25/2011   HCT 50.4 11/25/2011   MCV 91.2 11/25/2011   PLT 177.0 11/25/2011     Chemistry      Component Value Date/Time   NA 140 11/25/2011 0909   K 3.6 11/25/2011 0909   CL 103 11/25/2011 0909   CO2 30 11/25/2011 0909   BUN 9 11/25/2011 0909   CREATININE 1.1 11/25/2011 0909      Component Value Date/Time   CALCIUM 8.9 11/25/2011 0909   ALKPHOS 101 11/25/2011 0909   AST 23 11/25/2011 0909   ALT 19 11/25/2011 0909   BILITOT 0.5 11/25/2011 0909     Lab Results  Component Value Date   TSH 0.33* 11/25/2011   Free T4 and T3 total were normal 11/25/11.  IMPRESSION AND PLAN:  Allergic rhinitis More than likely has "irritant"  rhinitis more than allergies.   Also with recurrent sinusitis and pt-reported history of sinus polyps. Overall regarding this situation he is improved.  He wants to avoid surgery "at all costs" so he defers ENT referral or recheck of sinus CT at this time.  Will maximize medical mgmt. Continue allegra D, nasonex, nasal saline irrigation, and add singulair 10mg  qd today. Avoid OTC topical decongestant.  Reviewed allergy labs from last week: all normal.  GAD (generalized anxiety disorder) Start paxil 20mg  qd.  Therapeutic expectations and side effect profile of medication discussed today.  Patient's questions answered. Start clonazepam 1mg  q12h prn, #30, RF x 1. Recheck in office in 3-4 wks.  Fatigue Likely related to poor sleep secondary to chronic nasal congestion as of late, but with borderline low TSH recently I think it is reasonable to recheck thyroid panel with testosterone level in 31mo--he is agreeable with this plan.  Hyperlipidemia Recent lipid panel not optimal but not too bad. Encouraged low fat/low chol diet, continue frequent cardio exercise. We'll recheck FLP in 1 yr.     FOLLOW UP:  Return for 3-4 weeks for f/u anxiety/stress.

## 2012-01-05 ENCOUNTER — Ambulatory Visit: Payer: BC Managed Care – PPO | Admitting: Family Medicine

## 2012-01-05 DIAGNOSIS — Z0289 Encounter for other administrative examinations: Secondary | ICD-10-CM

## 2012-03-01 ENCOUNTER — Encounter: Payer: Self-pay | Admitting: Family Medicine

## 2012-03-01 ENCOUNTER — Ambulatory Visit (INDEPENDENT_AMBULATORY_CARE_PROVIDER_SITE_OTHER): Payer: BC Managed Care – PPO | Admitting: Family Medicine

## 2012-03-01 DIAGNOSIS — G4700 Insomnia, unspecified: Secondary | ICD-10-CM | POA: Insufficient documentation

## 2012-03-01 DIAGNOSIS — F411 Generalized anxiety disorder: Secondary | ICD-10-CM

## 2012-03-01 MED ORDER — CLONAZEPAM 1 MG PO TABS: ORAL_TABLET | ORAL | Status: DC

## 2012-03-01 MED ORDER — CITALOPRAM HYDROBROMIDE 20 MG PO TABS: 20.0000 mg | ORAL_TABLET | Freq: Every day | ORAL | Status: DC

## 2012-03-03 NOTE — Assessment & Plan Note (Signed)
Secondary to GAD/stress. Was improved on clonazepam so we're going to restart this (hopefully short term), while getting started on different SSRI (citalopram).

## 2012-03-03 NOTE — Assessment & Plan Note (Signed)
Not well controlled--causing exhaustion and insomnia. Intolerable GI side effects from paxil. Start trial of citalopram 20mg  qd and restart clonazepam 1mg , 1/2 tab bid prn, #45, RF x 1.  Therapeutic expectations and side effect profile of medication discussed today.  Patient's questions answered.

## 2012-03-03 NOTE — Progress Notes (Signed)
OFFICE VISIT  03/03/2012   CC:  Chief Complaint  Patient presents with  . Fatigue    not feeling well, off Paxil, out of clonazepam, ? low Hgb     HPI:    Patient is a 41 y.o. Caucasian male who presents for f/u anxiety and insomnia. Says he doesn't feel well, feels exhausted all the time.  Tries to lay down and go to sleep but his mind won't shut off.  Also often wakes up and cannot go back to sleep.  Avg 3 hours of sleep per night. Clonazepam 1/2 mg bid was helping him feel less stress and helped his sleep but he ran out. He tried taking paxil for 2 wks but he had persistent stomach cramps and loose stools on this med so he had to stop it. Denies restless legs, denies pain that is inhibitng sleep. Job stress is his biggest issue, he repeats "I take my job home with me".  ROS: appetite is good, denies depressed mood.  No mucle or joint pains, no HA's, no URI/ST/cough. No nausea, no rash, no erectile dysfunction.   He is not taking any OTC NSAIDs.  He is not drinking any alcohol or smoking cigs.  No drug use. Past Medical History  Diagnosis Date  . Ulcer 06/2010    NSAID-induced.  Duodenal bulb.  +GI bleed.  . Esophageal stricture 2011  . Duodenal stricture 2011  . Chronic neck pain     s/p falling off scaffold 1997.  This is better now.  [name] GERD (gastroesophageal reflux disease)   . Hyperlipidemia     History reviewed. No pertinent past surgical history.  Outpatient Prescriptions Prior to Visit  Medication Sig Dispense Refill  . fexofenadine (ALLEGRA) 180 MG tablet Take 1 tablet (180 mg total) by mouth daily.  30 tablet  6  . mometasone (NASONEX) 50 MCG/ACT nasal spray 2 sprays in each nostril once each morning  17 g  6  . montelukast (SINGULAIR) 10 MG tablet Take 1 tablet (10 mg total) by mouth at bedtime.  30 tablet  6  . omeprazole (PRILOSEC) 20 MG capsule Take 20 mg by mouth 2 (two) times daily.        . clonazePAM (KLONOPIN) 1 MG tablet Take 1 tablet (1 mg total) by  mouth 2 (two) times daily as needed for anxiety.  30 tablet  1  . PARoxetine (PAXIL) 20 MG tablet Take 1 tablet (20 mg total) by mouth every morning.  30 tablet  1    No Known Allergies  ROS As per HPI  PE: Blood pressure 124/80, pulse 69, temperature 98.5 F (36.9 C), temperature source Temporal, weight 172 lb (78.019 kg), SpO2 97.00%. Gen: Alert, tired-appearing, NAD.  Patient is oriented to person, place, time, and situation. Psych: affect is pleasant, makes good eye contact, displays lucid thought and speech. Wt Readings from Last 2 Encounters:  03/01/12 172 lb (78.019 kg)  12/08/11 177 lb (80.287 kg)   HEENT: PERRLA, EOMI.   Neck: no LAD, mass, or thyromegaly. CV: RRR, no m/r/g LUNGS: CTA bilat, nonlabored. NEURO: no tremor or tics noted on observation.  Coordination intact. CN 2-12 grossly intact bilaterally, strength 5/5 in all extremeties.  No ataxia.  LABS:  none  IMPRESSION AND PLAN:  GAD (generalized anxiety disorder) Not well controlled--causing exhaustion and insomnia. Intolerable GI side effects from paxil. Start trial of citalopram 20mg  qd and restart clonazepam 1mg , 1/2 tab bid prn, #45, RF x 1.  Therapeutic expectations and  side effect profile of medication discussed today.  Patient's questions answered.   Insomnia Secondary to GAD/stress. Was improved on clonazepam so we're going to restart this (hopefully short term), while getting started on different SSRI (citalopram).    FOLLOW UP: Return in about 1 month (around 04/01/2012) for f/u GAD and insomnia.

## 2012-04-01 ENCOUNTER — Ambulatory Visit: Payer: BC Managed Care – PPO | Admitting: Family Medicine

## 2012-04-15 ENCOUNTER — Ambulatory Visit: Payer: BC Managed Care – PPO | Admitting: Family Medicine

## 2012-04-22 ENCOUNTER — Other Ambulatory Visit: Payer: Self-pay

## 2012-04-22 MED ORDER — CITALOPRAM HYDROBROMIDE 20 MG PO TABS: 20.0000 mg | ORAL_TABLET | Freq: Every day | ORAL | Status: DC

## 2012-04-29 ENCOUNTER — Ambulatory Visit: Payer: BC Managed Care – PPO | Admitting: Family Medicine

## 2012-07-07 ENCOUNTER — Other Ambulatory Visit: Payer: Self-pay | Admitting: *Deleted

## 2012-07-07 MED ORDER — CLONAZEPAM 1 MG PO TABS: 0.5000 mg | ORAL_TABLET | Freq: Two times a day (BID) | ORAL | Status: DC | PRN

## 2012-07-07 NOTE — Telephone Encounter (Signed)
RX faxed.  [name] advised that pt needs appt for more refills.   CPE scheduled.

## 2012-07-07 NOTE — Telephone Encounter (Signed)
I approved #45 tabs.  Needs to f/u with me prior to further RFs. -thx

## 2012-07-07 NOTE — Telephone Encounter (Signed)
Faxed refill request received from pharmacy for CLONAZEPAM Last filled by MD on 03/01/12 Last seen on 03/01/12 Follow up needed in 03/2012.  Pt cancelled appt 4/12, we rescheduled 4/26 appt (MD out of office) and pt cancelled 5/10 appt.  Pt wife has also called requesting refill for patient. Please advise refills.

## 2012-07-27 ENCOUNTER — Encounter: Payer: Self-pay | Admitting: Family Medicine

## 2012-07-27 ENCOUNTER — Ambulatory Visit (INDEPENDENT_AMBULATORY_CARE_PROVIDER_SITE_OTHER): Payer: BC Managed Care – PPO | Admitting: Family Medicine

## 2012-07-27 VITALS — BP 122/80 | HR 61 | Temp 97.4°F | Ht 68.0 in | Wt 181.0 lb

## 2012-07-27 DIAGNOSIS — F411 Generalized anxiety disorder: Secondary | ICD-10-CM

## 2012-07-27 DIAGNOSIS — B351 Tinea unguium: Secondary | ICD-10-CM

## 2012-07-27 DIAGNOSIS — Z79899 Other long term (current) drug therapy: Secondary | ICD-10-CM

## 2012-07-27 DIAGNOSIS — Z0000 Encounter for general adult medical examination without abnormal findings: Secondary | ICD-10-CM

## 2012-07-27 MED ORDER — DIAZEPAM 5 MG PO TABS: ORAL_TABLET | ORAL | Status: AC

## 2012-07-27 MED ORDER — CITALOPRAM HYDROBROMIDE 40 MG PO TABS: 40.0000 mg | ORAL_TABLET | Freq: Every day | ORAL | Status: DC

## 2012-07-27 MED ORDER — OMEPRAZOLE 20 MG PO CPDR: 20.0000 mg | DELAYED_RELEASE_CAPSULE | Freq: Every day | ORAL | Status: DC

## 2012-07-27 MED ORDER — TERBINAFINE HCL 250 MG PO TABS: 250.0000 mg | ORAL_TABLET | Freq: Every day | ORAL | Status: DC

## 2012-07-27 NOTE — Assessment & Plan Note (Signed)
Reviewed age and gender appropriate health maintenance issues (prudent diet, regular exercise, health risks of tobacco and excessive alcohol, use of seatbelts, fire alarms in home, use of sunscreen).  Also reviewed age and gender appropriate health screening as well as vaccine recommendations. He is to work more on lifestyle mod to help control weight and reduce his risk of hyperlipidemia, DM, obesity, CV disease, etc. No screening lab work needed at this time (last done 11/2011).

## 2012-07-27 NOTE — Progress Notes (Signed)
Office Note 07/27/2012  CC:  Chief Complaint  Patient presents with  . Annual Exam    discuss clonazepam also    HPI:  Juan Meyer is a 41 y.o. White male who is here for annual health maintenance exam and discuss clonazepam.  Says it makes him too drowsy.  Taking citalopram 20mg  has helped his GAD but still needs to take a clonazepam 1/2 per day or qod for anxiety--not entirely clear.  Specifically, his irritability, chronic worry, and poor energy/poor concentration has improved but not entirely.  Denies depressed mood or periods of euphoria.  Denies drug or alcohol use.  Complains of thick toenails, substance under nails, unresponsive to topical antifungal treatment daily for 1 yr per pt report today.  Sometimes he gets foot rash that itches and he treats with OTC foot fungus med and this goes away.  He does wear heavy work boots all day.  Past Medical History  Diagnosis Date  . Ulcer 06/2010    NSAID-induced.  Duodenal bulb.  +GI bleed.  . Esophageal stricture 2011  . Duodenal stricture 2011  . Chronic neck pain     s/p falling off scaffold 1997.  This is better now.  [name] GERD (gastroesophageal reflux disease)   . Hyperlipidemia     History reviewed. No pertinent past surgical history.  History reviewed. No pertinent family history.  History   Social History  . Marital Status: Married    Spouse Name: [name]    Number of Children: N/A  . Years of Education: N/A   Occupational History  . Maintenance Plant Mgr    Social History Main Topics  . Smoking status: Never Smoker   . Smokeless tobacco: Never Used  . Alcohol Use: No  . Drug Use: No  . Sexually Active: Not on file   Other Topics Concern  . 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  No T/A/Ds.  Lots of dust exposure at work but no other potential lung irritants/carcinogens.Exercised 5d/week up until 6 mo ago.  Now does this  less.    Outpatient Prescriptions Prior to Visit  Medication Sig Dispense Refill  . fexofenadine (ALLEGRA) 180 MG tablet Take 1 tablet (180 mg total) by mouth daily.  30 tablet  6  . mometasone (NASONEX) 50 MCG/ACT nasal spray 2 sprays in each nostril once each morning  17 g  6  . citalopram (CELEXA) 20 MG tablet Take 1 tablet (20 mg total) by mouth daily.  30 tablet  2  . clonazePAM (KLONOPIN) 1 MG tablet Take 0.5-1 tablets (0.5-1 mg total) by mouth 2 (two) times daily as needed for anxiety.  45 tablet  0  . omeprazole (PRILOSEC) 20 MG capsule Take 20 mg by mouth 2 (two) times daily.        . montelukast (SINGULAIR) 10 MG tablet Take 1 tablet (10 mg total) by mouth at bedtime.  30 tablet  6    No Known Allergies  ROS Review of Systems  Constitutional: Negative for fever, chills, appetite change and fatigue.  HENT: Negative for ear pain, congestion, sore throat, rhinorrhea, neck stiffness, dental problem, postnasal drip and sinus pressure.   Eyes: Negative for discharge, redness and visual disturbance.  Respiratory: Negative for cough, chest tightness, shortness of breath and wheezing.   Cardiovascular: Negative for chest pain, palpitations and leg swelling.  Gastrointestinal: Negative for nausea, vomiting, abdominal pain, diarrhea and blood in  stool.  Genitourinary: Negative for dysuria, urgency, frequency, hematuria, flank pain and difficulty urinating.  Musculoskeletal: Negative for myalgias, back pain, joint swelling and arthralgias.  Skin: Negative for pallor and rash.       Toenails thickened x years  Neurological: Negative for dizziness, speech difficulty, weakness and headaches.  Hematological: Negative for adenopathy. Does not bruise/bleed easily.  Psychiatric/Behavioral: Negative for confusion, disturbed wake/sleep cycle and dysphoric mood.    PE; Blood pressure 122/80, pulse 61, temperature 97.4 F (36.3 C), temperature source Temporal, height 5\' 8"  (1.727 m), weight 181  lb (82.101 kg). Gen: Alert, well appearing.  Patient is oriented to person, place, time, and situation. Affect: mildly flattened, range of emotion a bit limited.  Thought and speech are lucid. ENT: Ears: EACs clear, normal epithelium.  TMs with good light reflex and landmarks bilaterally.  Eyes: no injection, icteris, swelling, or exudate.  EOMI, PERRLA. Nose: no drainage or turbinate edema/swelling.  No injection or focal lesion.  Mouth: lips without lesion/swelling.  Oral mucosa pink and moist.  Dentition intact and without obvious caries or gingival swelling.  Oropharynx without erythema, exudate, or swelling.  Neck: supple/nontender.  No LAD, mass, or TM.  Carotid pulses 2+ bilaterally, without bruits. CV: RRR, no m/r/g.   LUNGS: CTA bilat, nonlabored resps, good aeration in all lung fields. ABD: soft, NT, ND, BS normal.  No hepatospenomegaly or mass.  No bruits. EXT: no clubbing, cyanosis, or edema.  Genitals normal; both testes normal without tenderness, masses, hydroceles, varicoceles, erythema or swelling. Shaft normal, circumcised, meatus normal without discharge. No inguinal hernia noted. No inguinal lymphadenopathy.  Pertinent labs:  None today  ASSESSMENT AND PLAN:   Health maintenance examination Reviewed age and gender appropriate health maintenance issues (prudent diet, regular exercise, health risks of tobacco and excessive alcohol, use of seatbelts, fire alarms in home, use of sunscreen).  Also reviewed age and gender appropriate health screening as well as vaccine recommendations. He is to work more on lifestyle mod to help control weight and reduce his risk of hyperlipidemia, DM, obesity, CV disease, etc. No screening lab work needed at this time (last done 11/2011).  GAD (generalized anxiety disorder) Improved but not in remission. Increase citalopram to 30mg  qd for 2 wks and if no side effects then increase to 40mg  qd. Also, d/c clonazepam and start trial of diazepam  5mg , 1-2 q12h prn to see if this causes less sedative side effect. My goal is to get him much better with SSRI so that benzo needs are minimal.   Onychomycosis Terbinafine 250mg  po qd x 12 wks. Get hepatic panel 6 wks from now (baseline from 11/2011 normal).     FOLLOW UP:  Return in about 2 months (around 09/26/2012), or Lab appt in 6 wks to check hepatic panel (ordered), for f/u GAD.

## 2012-07-27 NOTE — Patient Instructions (Signed)
Health Maintenance, Males A healthy lifestyle and preventative care can promote health and wellness.  Maintain regular health, dental, and eye exams.   Eat a healthy diet. Foods like vegetables, fruits, whole grains, low-fat dairy products, and lean protein foods contain the nutrients you need without too many calories. Decrease your intake of foods high in solid fats, added sugars, and salt. Get information about a proper diet from your caregiver, if necessary.   Regular physical exercise is one of the most important things you can do for your health. Most adults should get at least 150 minutes of moderate-intensity exercise (any activity that increases your heart rate and causes you to sweat) each week. In addition, most adults need muscle-strengthening exercises on 2 or more days a week.    Maintain a healthy weight. The body mass index (BMI) is a screening tool to identify possible weight problems. It provides an estimate of body fat based on height and weight. Your caregiver can help determine your BMI, and can help you achieve or maintain a healthy weight. For adults 20 years and older:   A BMI below 18.5 is considered underweight.   A BMI of 18.5 to 24.9 is normal.   A BMI of 25 to 29.9 is considered overweight.   A BMI of 30 and above is considered obese.   Maintain normal blood lipids and cholesterol by exercising and minimizing your intake of saturated fat. Eat a balanced diet with plenty of fruits and vegetables. Blood tests for lipids and cholesterol should begin at age 20 and be repeated every 5 years. If your lipid or cholesterol levels are high, you are over 50, or you are a high risk for heart disease, you may need your cholesterol levels checked more frequently.Ongoing high lipid and cholesterol levels should be treated with medicines, if diet and exercise are not effective.   If you smoke, find out from your caregiver how to quit. If you do not use tobacco, do not start.    If you choose to drink alcohol, do not exceed 2 drinks per day. One drink is considered to be 12 ounces (355 mL) of beer, 5 ounces (148 mL) of wine, or 1.5 ounces (44 mL) of liquor.   Avoid use of street drugs. Do not share needles with anyone. Ask for help if you need support or instructions about stopping the use of drugs.   High blood pressure causes heart disease and increases the risk of stroke. Blood pressure should be checked at least every 1 to 2 years. Ongoing high blood pressure should be treated with medicines if weight loss and exercise are not effective.   If you are 45 to 43 years old, ask your caregiver if you should take aspirin to prevent heart disease.   Diabetes screening involves taking a blood sample to check your fasting blood sugar level. This should be done once every 3 years, after age 45, if you are within normal weight and without risk factors for diabetes. Testing should be considered at a younger age or be carried out more frequently if you are overweight and have at least 1 risk factor for diabetes.   Colorectal cancer can be detected and often prevented. Most routine colorectal cancer screening begins at the age of 50 and continues through age 75. However, your caregiver may recommend screening at an earlier age if you have risk factors for colon cancer. On a yearly basis, your caregiver may provide home test kits to check for hidden   blood in the stool. Use of a small camera at the end of a tube, to directly examine the colon (sigmoidoscopy or colonoscopy), can detect the earliest forms of colorectal cancer. Talk to your caregiver about this at age 50, when routine screening begins. Direct examination of the colon should be repeated every 5 to 10 years through age 75, unless early forms of pre-cancerous polyps or small growths are found.   Hepatitis C blood testing is recommended for all people born from 1945 through 1965 and any individual with known risks for  hepatitis C.   Healthy men should no longer receive prostate-specific antigen (PSA) blood tests as part of routine cancer screening. Consult with your caregiver about prostate cancer screening.   Testicular cancer screening is not recommended for adolescents or adult males who have no symptoms. Screening includes self-exam, caregiver exam, and other screening tests. Consult with your caregiver about any symptoms you have or any concerns you have about testicular cancer.   Practice safe sex. Use condoms and avoid high-risk sexual practices to reduce the spread of sexually transmitted infections (STIs).   Use sunscreen with a sun protection factor (SPF) of 30 or greater. Apply sunscreen liberally and repeatedly throughout the day. You should seek shade when your shadow is shorter than you. Protect yourself by wearing long sleeves, pants, a wide-brimmed hat, and sunglasses year round, whenever you are outdoors.   Notify your caregiver of new moles or changes in moles, especially if there is a change in shape or color. Also notify your caregiver if a mole is larger than the size of a pencil eraser.   A one-time screening for abdominal aortic aneurysm (AAA) and surgical repair of large AAAs by sound wave imaging (ultrasonography) is recommended for ages 65 to 75 years who are current or former smokers.   Stay current with your immunizations.  Document Released: 06/04/2008 Document Revised: 11/26/2011 Document Reviewed: 05/04/2011 [employer] Patient Information 2012 [employer]. 

## 2012-07-27 NOTE — Assessment & Plan Note (Signed)
Improved but not in remission. Increase citalopram to 30mg  qd for 2 wks and if no side effects then increase to 40mg  qd. Also, d/c clonazepam and start trial of diazepam 5mg , 1-2 q12h prn to see if this causes less sedative side effect. My goal is to get him much better with SSRI so that benzo needs are minimal.

## 2012-07-27 NOTE — Assessment & Plan Note (Signed)
Terbinafine 250mg  po qd x 12 wks. Get hepatic panel 6 wks from now (baseline from 11/2011 normal).

## 2012-08-29 ENCOUNTER — Ambulatory Visit: Payer: BC Managed Care – PPO | Admitting: Family Medicine

## 2012-08-30 ENCOUNTER — Encounter: Payer: Self-pay | Admitting: Family Medicine

## 2012-08-30 ENCOUNTER — Encounter: Payer: BC Managed Care – PPO | Admitting: Family Medicine

## 2012-08-30 ENCOUNTER — Telehealth: Payer: Self-pay | Admitting: Family Medicine

## 2012-08-30 MED ORDER — SUMATRIPTAN SUCCINATE 100 MG PO TABS: ORAL_TABLET | ORAL | Status: DC

## 2012-08-30 NOTE — Telephone Encounter (Signed)
Fine.  Will eRx imitrex.

## 2012-08-30 NOTE — Telephone Encounter (Signed)
Patient went to the ER last night (patient's wife is faxing the paperwork to [place]). He was diagnosed with a mild heat stroke. Feels that the lamisil is making the patient sick. The patient is home from work today sick  with a migraine. The patient's wife said she has been taking imitrex which has worked well for her migraines, is this a med that would work well for him? Also the patient is requesting an Rx for valium. Please send in Rx and call patient ASAP.

## 2012-08-30 NOTE — Telephone Encounter (Signed)
LMOM to call back so I could ask additional questions about his HAs. He has no hx of migraines noted in chart and I want to make sure he is actually having a migraine HA before I rx imitrex.

## 2012-08-30 NOTE — Telephone Encounter (Signed)
RC from wife.  States paperwork she faxed says that Juan Meyer was having a classic migraine last night.  MD was going to write Imitrex for him, but left without doing it.  They would like RX for Imitrex.  Also, was told to take valium to help combat migraine pain.  They would like refill.  Per Onalee Hua at [facility], pt has 2 refills remaining on RX sent in on 07/27/12.  Onalee Hua will process a refill on that. Please advise regarding Imitrex.

## 2012-08-30 NOTE — Telephone Encounter (Signed)
Does pt need to come in or can we take care of over the phone?  Please advise.

## 2012-09-02 IMAGING — CT CT ANGIO CHEST
2 of 7 series · 18 of 36 positions shown · IV contrast (CONTRAST)
Comparison: Chest radiograph of 07/04/2011

CLINICAL DATA: Bilateral arm numbness and burning.  History of
esophageal stricture.

CT ANGIOGRAPHY CHEST WITH CONTRAST
TECHNIQUE: Multidetector CT imaging of the chest was performed
using the standard protocol during bolus administration of
intravenous contrast.  Multiplanar CT image reconstructions
including MIPs were obtained to evaluate the vascular anatomy.
Contrast:  60 ml [id number]

[Series 6: pe thins · axial · 0.62mm/px · z∈[-136,+111]mm · 17 of 279 slices shown]
[im 16/279  lung]
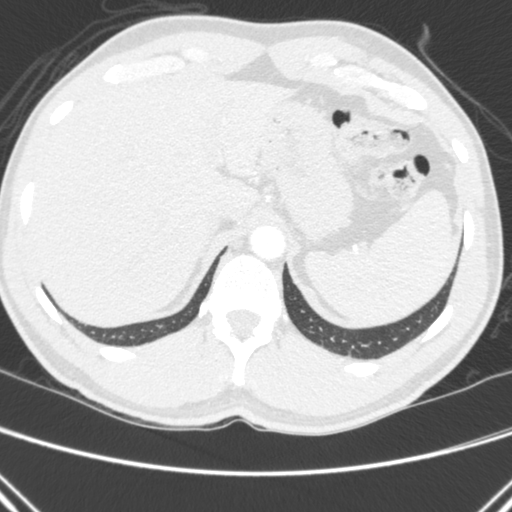
[im 31/279  mediastinal]
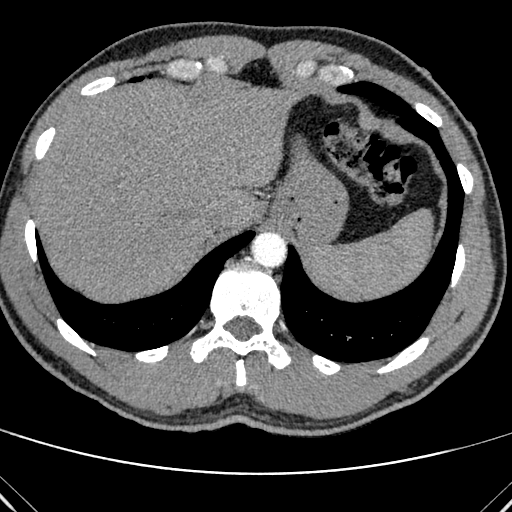
[im 47/279  lung]
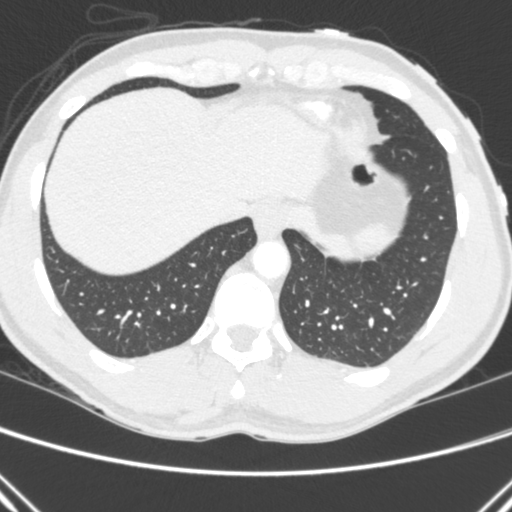
[im 62/279  mediastinal]
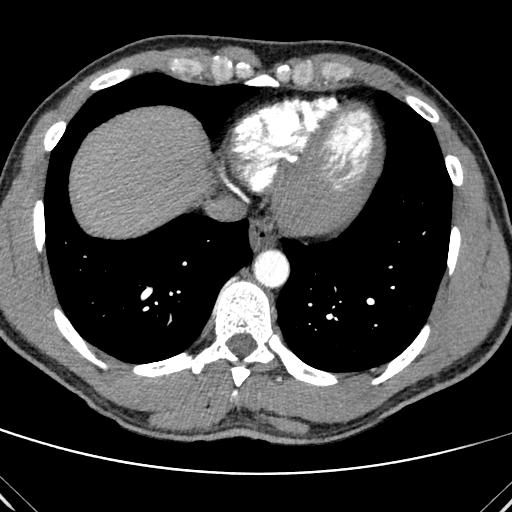
[im 78/279  lung]
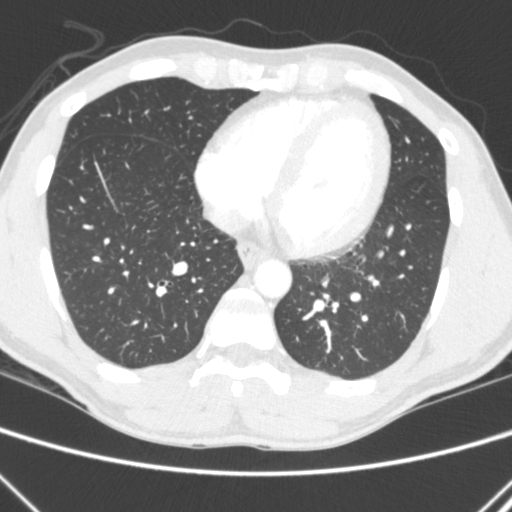
[im 93/279  mediastinal]
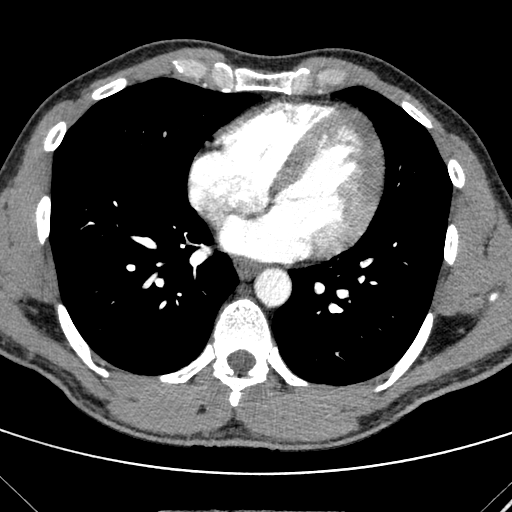
[im 109/279  lung]
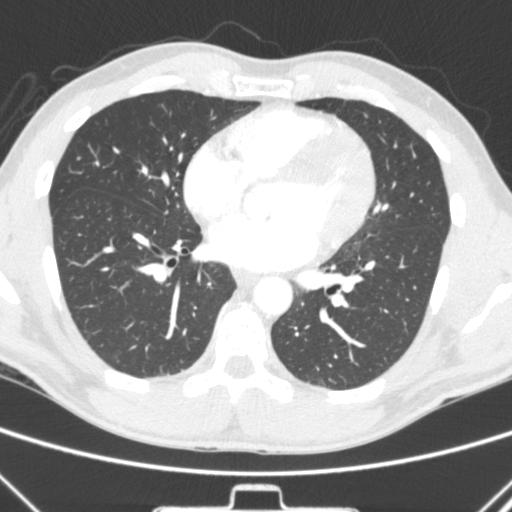
[im 124/279  mediastinal]
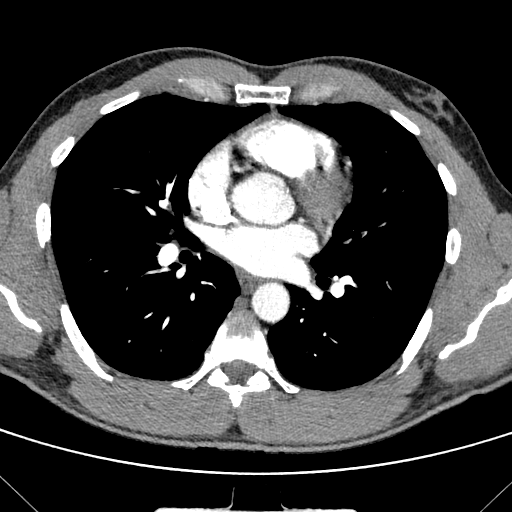
[im 140/279  lung]
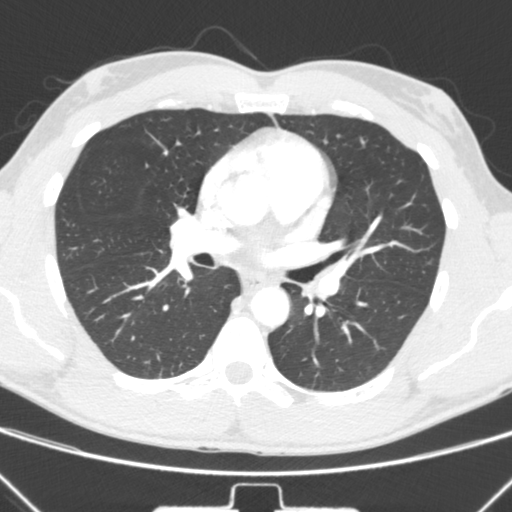
[im 155/279  mediastinal]
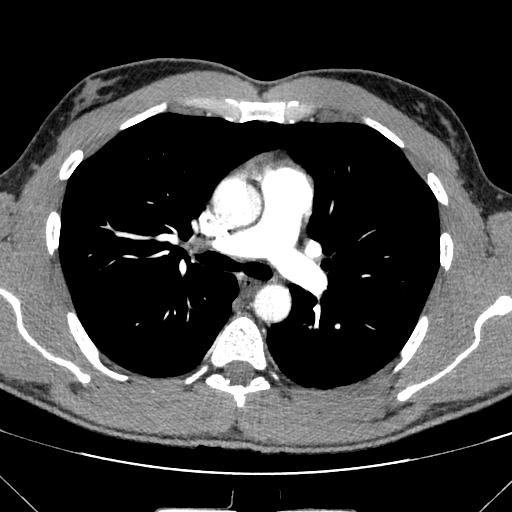
[im 170/279  lung]
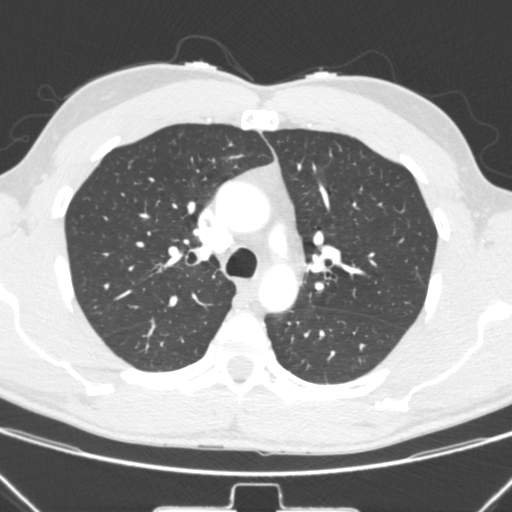
[im 186/279  mediastinal]
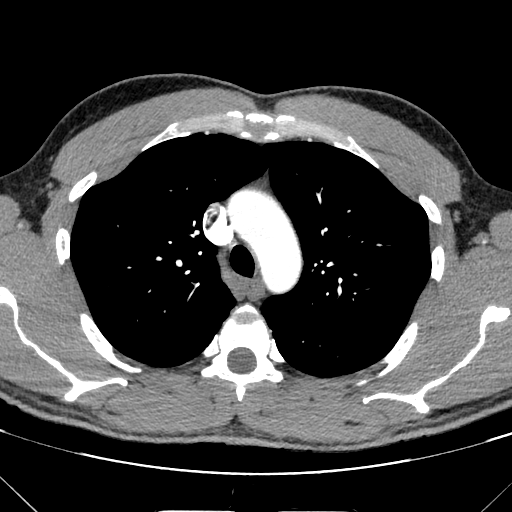
[im 201/279  lung]
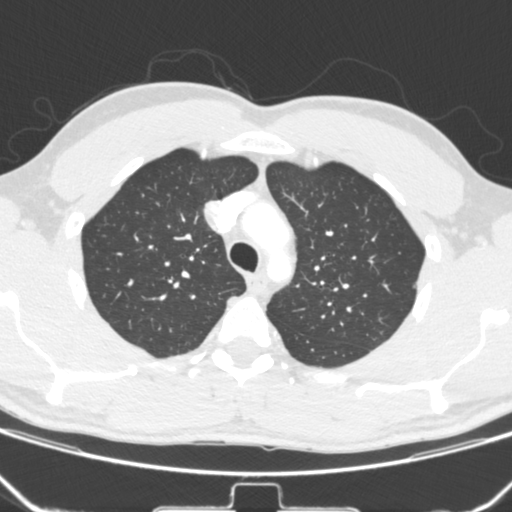
[im 217/279  mediastinal]
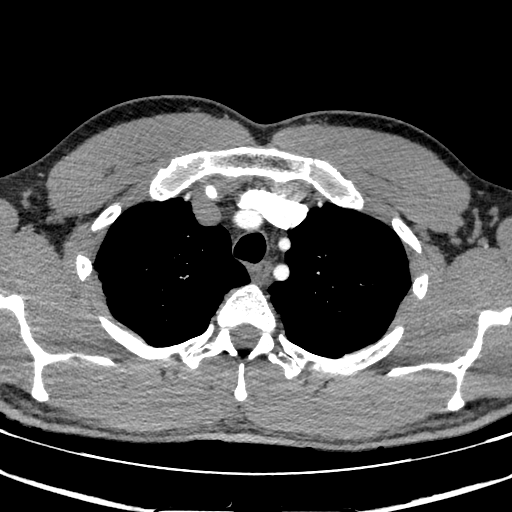
[im 232/279  lung]
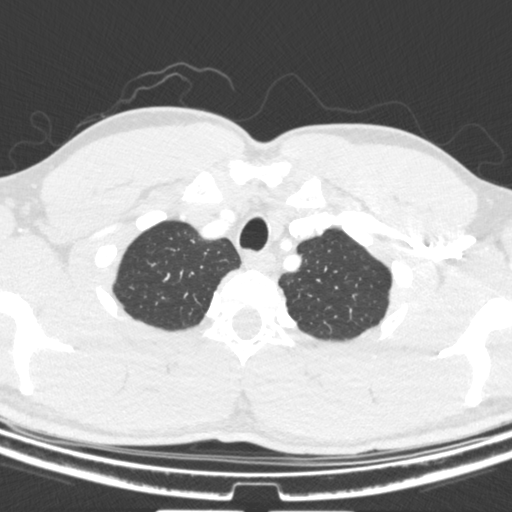
[im 248/279  mediastinal]
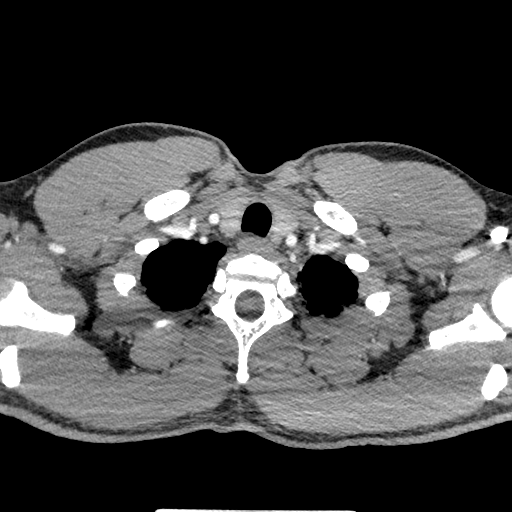
[im 263/279  lung]
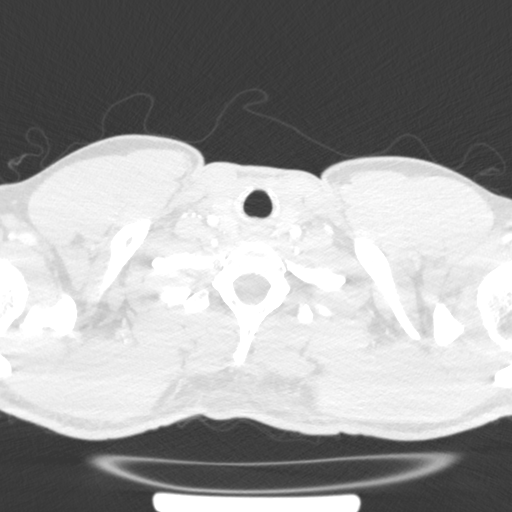

[coronal mpr · coronal · 0.62mm/px · 1 of 103 slices shown]
[im 52/103  mediastinal]
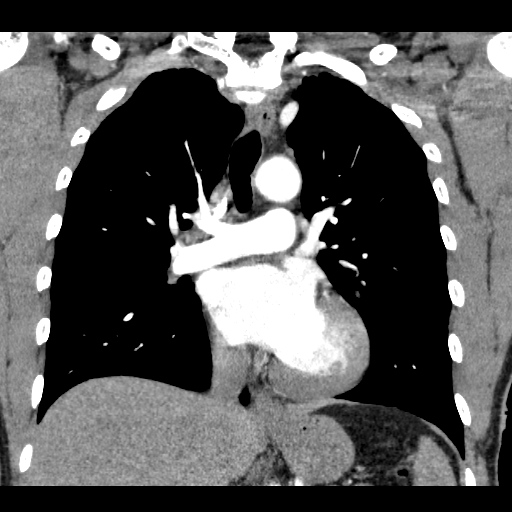

[18 of 36 positions shown; findings below may reference images not displayed]

FINDINGS: No filling defect is identified in the pulmonary
arterial tree to suggest pulmonary embolus.

No aortic dissection identified.  No specific findings of right
heart strain.

No esophageal dilatation is identified.  Small bilateral axillary
nodes are not pathologically enlarged by size criteria.  No
pericardial effusion.

Mild biapical pleuroparenchymal scarring noted.

A 3 mm pleural-based nodule in the right lower lobe is shown on
image 87 of series 7.  A similar 3 mm pleural-based nodule is
present along the left upper lobe on image 52 of series 7.  A 4 mm
left lower lobe nodule is present on image 112 of series 7.

Review of the MIP images confirms the above findings.
IMPRESSION: 1.  No embolus identified.
2.  Several pulmonary nodules are 4 mm in size or less. If the
patient is at high risk for bronchogenic carcinoma, follow-up chest
CT at 1 year is recommended.  If the patient is at low risk, no
follow-up is needed.  This recommendation follows the consensus
statement: Guidelines for Management of Small Pulmonary Nodules
Detected on CT Scans:  A Statement from the [HOSPITAL] as
[URL]

## 2012-09-02 IMAGING — CT CT CERVICAL SPINE W/O CM
4 of 5 series · 16 of 33 positions shown, 18 images · non-contrast
Comparison: None.

CLINICAL DATA: Bilateral hand numbness.

CT CERVICAL SPINE WITHOUT CONTRAST
TECHNIQUE: Multidetector CT imaging of the cervical spine was
performed. Multiplanar CT image reconstructions were also
generated.

[Series 3: soft tissue · axial · 0.27mm/px · z∈[+79,+243]mm · 6 of 116 slices shown]
[im 17/116  soft-tissue]
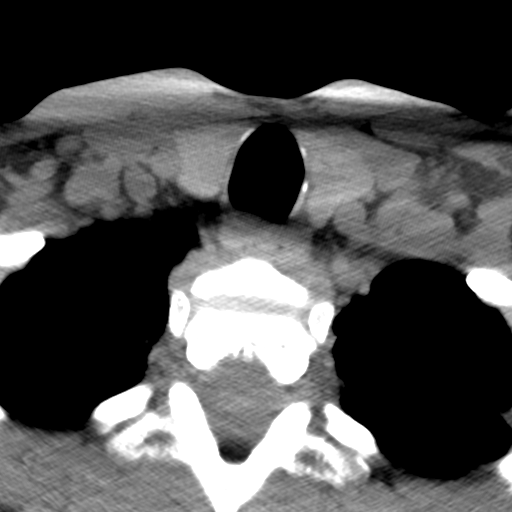
[im 33/116  soft-tissue]
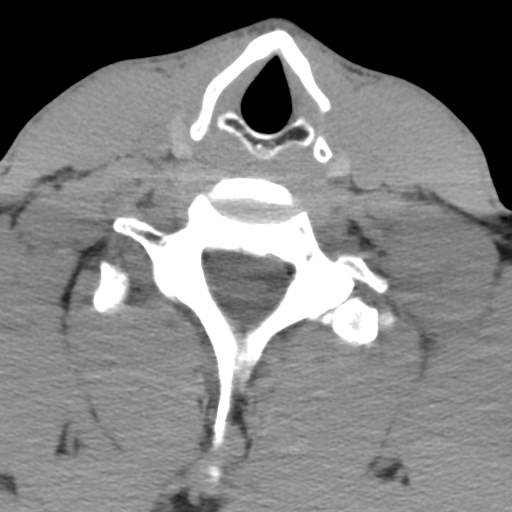
[im 50/116  soft-tissue]
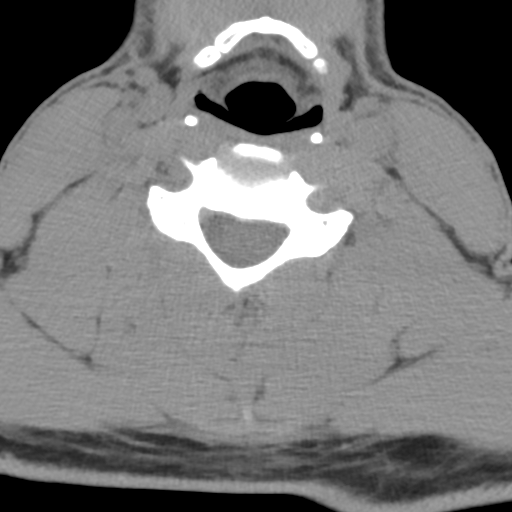
[im 66/116  soft-tissue]
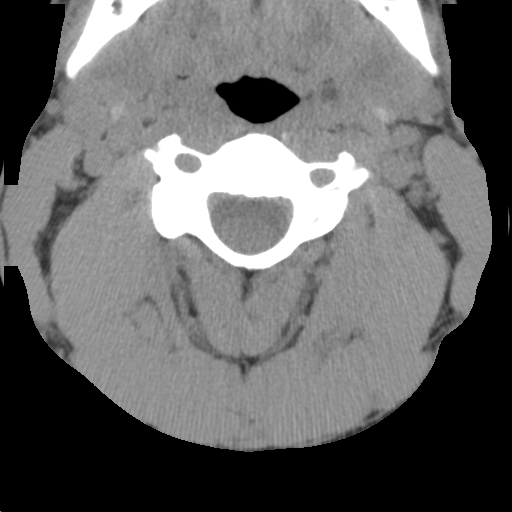
[im 83/116  soft-tissue]
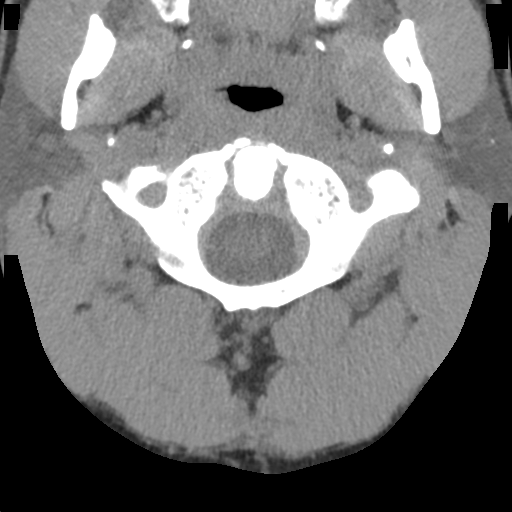
[im 99/116  soft-tissue]
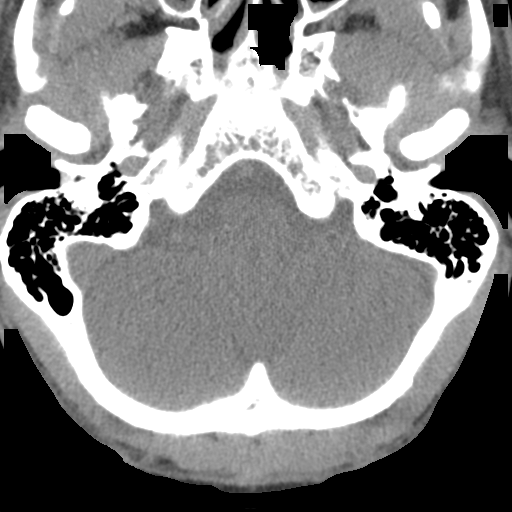

[mpr, sagittal, sagittal · sagittal · 0.45mm/px · 5 of 40 slices shown, 6 images]
[im 14/40  bone]
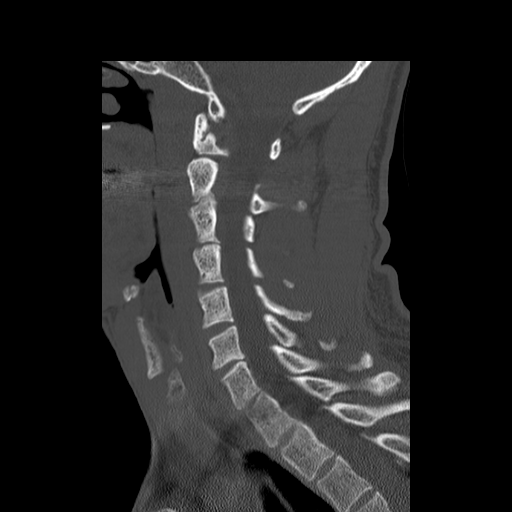
[im 17/40  bone]
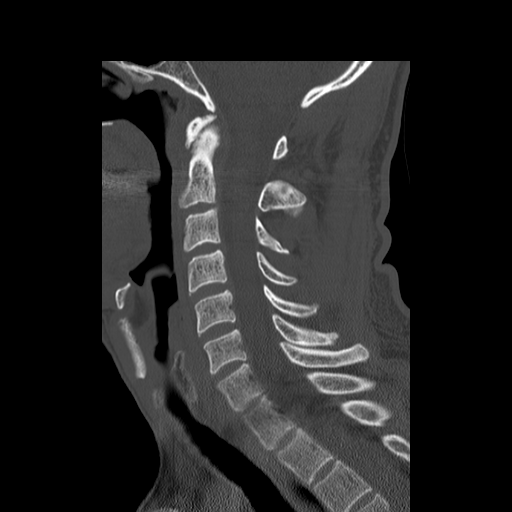
[im 20/40  soft-tissue]
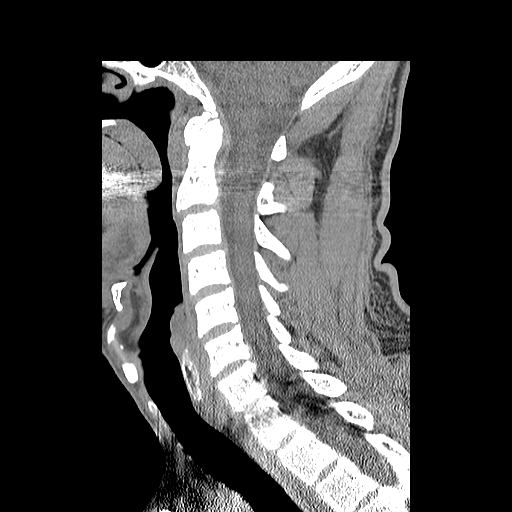
[im 20/40  bone]
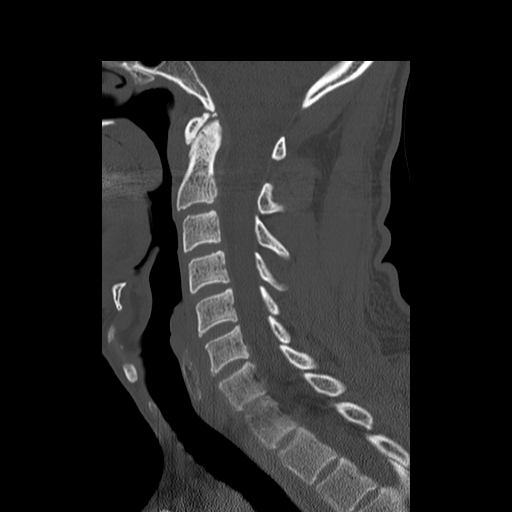
[im 23/40  bone]
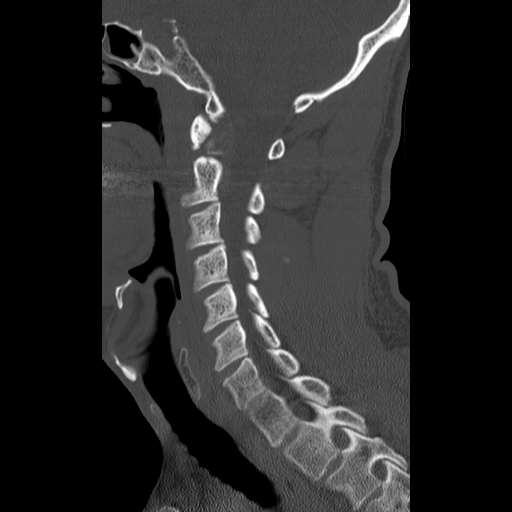
[im 27/40  bone]
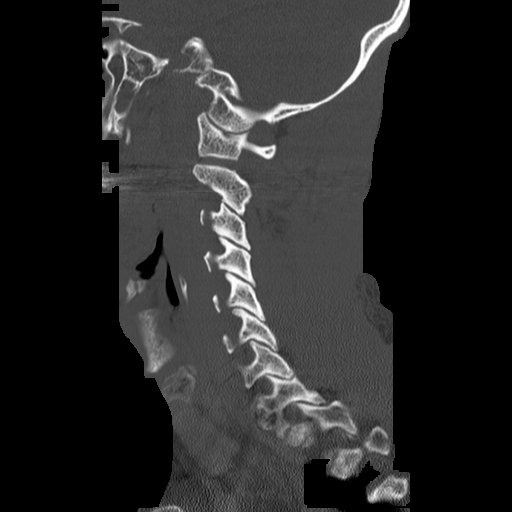

[coronal · coronal · 0.45mm/px · 3 of 43 slices shown]
[im 9/43  bone]
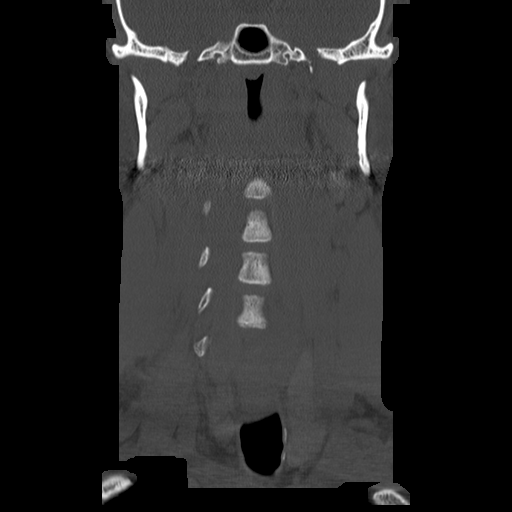
[im 17/43  bone]
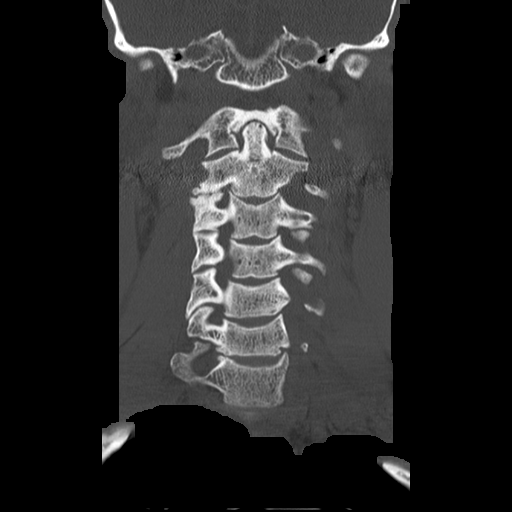
[im 26/43  bone]
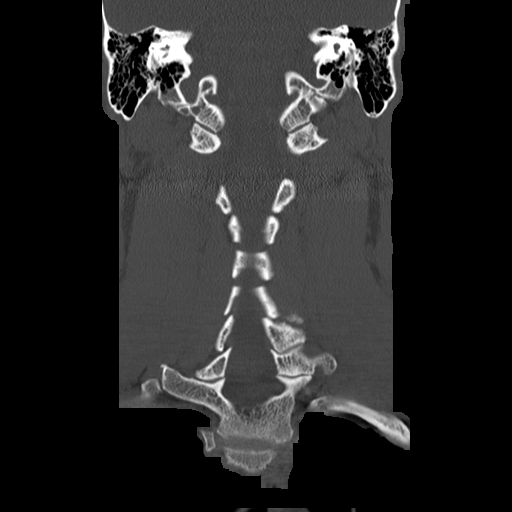

[axial mpr · axial · 0.45mm/px · z∈[+144,+183]mm · 2 of 68 slices shown, 3 images]
[im 23/68  soft-tissue]
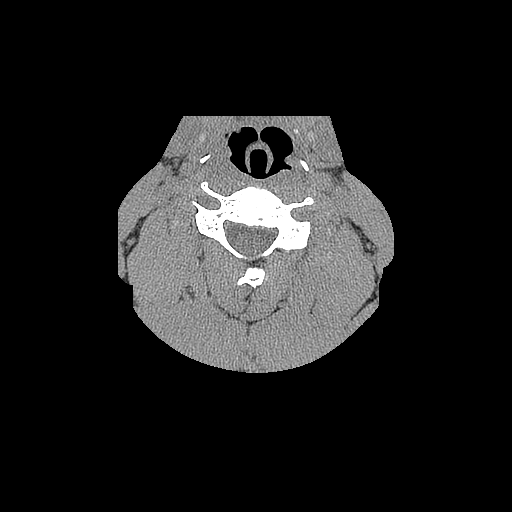
[im 23/68  bone]
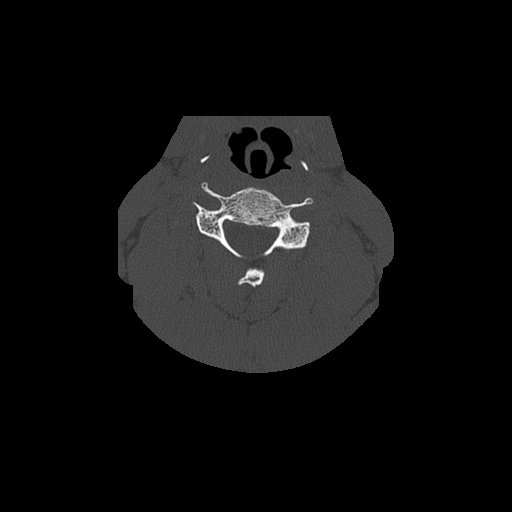
[im 45/68  bone]
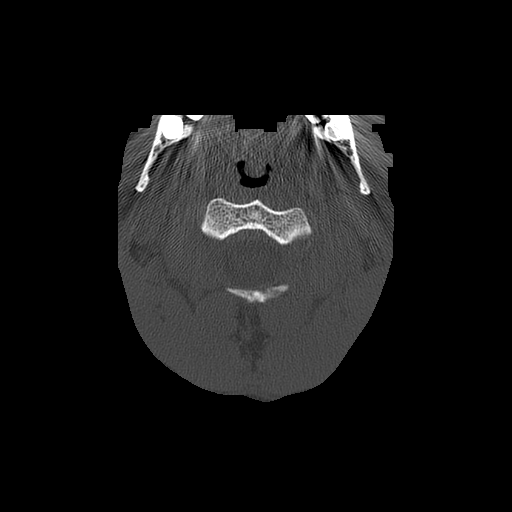

[16 of 33 positions shown; findings below may reference images not displayed]

FINDINGS: No cervical spine fracture or malalignment identified.

Minimal facet and uncinate spurring noted on the left at C3-4, C4-
5, and C5-6.  There is mild uncinate spurring on the right as C3-4
and C6-7.

Osseous right foraminal narrowing is present at C2-3 due to facet
arthropathy, and there is borderline osseous foraminal narrowing on
the left at C3-4, C4-5, and C5-6.  There is also borderline osseous
foraminal narrowing on the right at C6-7.

There is evidence of chronic maxillary sinusitis.  Minimal chronic
sphenoid sinusitis noted.

There is suspicion for small central disc protrusions at C4-5 and
C5-6.  Left eccentric disc bulge is suspected at C6-7.
IMPRESSION: 1.  Facet and uncinate spurring cause multilevel borderline
foraminal narrowing.  There is mild right foraminal stenosis at C2-
3 due to facet arthropathy.

2.  Chronic paranasal sinusitis.

3.  No fracture or subluxation identified.
4.  Degenerative disc disease at C4-5, C5-6, and C6-7.

## 2012-09-07 ENCOUNTER — Other Ambulatory Visit: Payer: BC Managed Care – PPO

## 2012-09-19 ENCOUNTER — Ambulatory Visit (INDEPENDENT_AMBULATORY_CARE_PROVIDER_SITE_OTHER): Payer: BC Managed Care – PPO | Admitting: Family Medicine

## 2012-09-19 ENCOUNTER — Encounter: Payer: Self-pay | Admitting: Family Medicine

## 2012-09-19 VITALS — BP 128/81 | HR 63 | Temp 98.7°F | Ht 68.0 in | Wt 178.0 lb

## 2012-09-19 DIAGNOSIS — K645 Perianal venous thrombosis: Secondary | ICD-10-CM | POA: Insufficient documentation

## 2012-09-19 DIAGNOSIS — Z23 Encounter for immunization: Secondary | ICD-10-CM

## 2012-09-19 DIAGNOSIS — G43009 Migraine without aura, not intractable, without status migrainosus: Secondary | ICD-10-CM | POA: Insufficient documentation

## 2012-09-19 DIAGNOSIS — R1013 Epigastric pain: Secondary | ICD-10-CM

## 2012-09-19 DIAGNOSIS — E0590 Thyrotoxicosis, unspecified without thyrotoxic crisis or storm: Secondary | ICD-10-CM

## 2012-09-19 DIAGNOSIS — Z8719 Personal history of other diseases of the digestive system: Secondary | ICD-10-CM

## 2012-09-19 LAB — COMPREHENSIVE METABOLIC PANEL
AST: 20 U/L (ref 0–37)
Alkaline Phosphatase: 92 U/L (ref 39–117)
BUN: 15 mg/dL (ref 6–23)
Creatinine, Ser: 1 mg/dL (ref 0.4–1.5)
Glucose, Bld: 82 mg/dL (ref 70–99)
Potassium: 4.3 mEq/L (ref 3.5–5.1)
Total Bilirubin: 0.7 mg/dL (ref 0.3–1.2)

## 2012-09-19 LAB — CBC WITH DIFFERENTIAL/PLATELET
Basophils Relative: 0.6 % (ref 0.0–3.0)
Eosinophils Absolute: 0.2 10*3/uL (ref 0.0–0.7)
Eosinophils Relative: 3.3 % (ref 0.0–5.0)
HCT: 47.9 % (ref 39.0–52.0)
Lymphs Abs: 2.2 10*3/uL (ref 0.7–4.0)
MCHC: 33.4 g/dL (ref 30.0–36.0)
MCV: 91.1 fl (ref 78.0–100.0)
Monocytes Absolute: 0.4 10*3/uL (ref 0.1–1.0)
Neutro Abs: 4.7 10*3/uL (ref 1.4–7.7)
Neutrophils Relative %: 61.8 % (ref 43.0–77.0)
RBC: 5.26 Mil/uL (ref 4.22–5.81)

## 2012-09-19 MED ORDER — SUMATRIPTAN SUCCINATE 100 MG PO TABS: ORAL_TABLET | ORAL | Status: DC

## 2012-09-19 MED ORDER — SUCRALFATE 1 G PO TABS: 1.0000 g | ORAL_TABLET | Freq: Four times a day (QID) | ORAL | Status: DC

## 2012-09-19 NOTE — Assessment & Plan Note (Addendum)
Largely a matter of dietary indiscretion lately, but with his history of bleeding ulcer in the past I'll check hemoccult x 3 and CBC. Reassured pt today that his POCT hb was fine, hemoccult in office negative.  His black stool is from the pepto bismol he took prior. Rx'd carafate 1g qid prn.   Continue omeprazole bid. D/C lamisil in case this has been giving GI upset, plus will check CMET. Since I'm drawing blood already today, will recheck TSH due to SLIGHTLY low TSH (with normal T4 and T3) back in 11/2011.

## 2012-09-19 NOTE — Progress Notes (Signed)
OFFICE NOTE  09/19/2012  CC:  Chief Complaint  Patient presents with  . Stool Color Change    black stool x 2 BM, took Pepto on Friday and Saturday for bloating after increased soft drink intake, still taking omeprazole     HPI: Patient is a 41 y.o. Caucasian male who is here for "black stool". Several nights ago felt GERD so he took pepto x 2 nights in a row, and for the next few stools he had a mushy, black colored stool.  No BRBPR.  Says he has felt mild, vague GI upset that he thinks may have started after getting on lamisil, he's not sure.  Last couple of days has had more significant pain in mid epigastric region like he felt in the past when he had an ulcer.  He admits that his diet often is "candy bar and coke" due to his stressful/hectic job.  Also has a small bump at anal opening he asks me to look at. He is very anxious, does not want to miss a bleeding ulcer.  No recent n/v.  Stools a bit looser lately. No fevers.   + occipital area/bitemporal HA's lately with his increased epigastric pain.    Pertinent PMH:  Past Medical History  Diagnosis Date  . Ulcer 06/2010    NSAID-induced.  Duodenal bulb.  +GI bleed.  . Esophageal stricture 2011  . Duodenal stricture 2011  . Chronic neck pain     s/p falling off scaffold 1997.  This is better now.  [name] GERD (gastroesophageal reflux disease)   . Hyperlipidemia   . Migraine headache     MEDS:  Outpatient Prescriptions Prior to Visit  Medication Sig Dispense Refill  . citalopram (CELEXA) 40 MG tablet Take 1 tablet (40 mg total) by mouth daily.  30 tablet  3  . clonazePAM (KLONOPIN) 1 MG tablet Take 1-2 tablets by mouth Every 12 hours as needed.      . fexofenadine (ALLEGRA) 180 MG tablet Take 1 tablet (180 mg total) by mouth daily.  30 tablet  6  . mometasone (NASONEX) 50 MCG/ACT nasal spray 2 sprays in each nostril once each morning  17 g  6  . omeprazole (PRILOSEC) 20 MG capsule Take 1 capsule (20 mg total) by mouth daily.  30  capsule  6  . terbinafine (LAMISIL) 250 MG tablet Take 1 tablet (250 mg total) by mouth daily.  30 tablet  2  . SUMAtriptan (IMITREX) 100 MG tablet 1/2-1 tab po at onset of headache.  May repeat dose in 2 hours if HA is not 50% improved.  Max dose in 24h is 200 mg.  9 tablet  0  . montelukast (SINGULAIR) 10 MG tablet Take 1 tablet (10 mg total) by mouth at bedtime.  30 tablet  6    PE: Blood pressure 128/81, pulse 63, temperature 98.7 F (37.1 C), temperature source Temporal, height 5\' 8"  (1.727 m), weight 178 lb (80.74 kg). Gen: Alert, well appearing.  Patient is oriented to person, place, time, and situation. No pallor or jandice. ENT: Ears: EACs clear, normal epithelium.  TMs with good light reflex and landmarks bilaterally.  Eyes: no injection, icteris, swelling, or exudate.  EOMI, PERRLA. Nose: no drainage or turbinate edema/swelling.  No injection or focal lesion.  Mouth: lips without lesion/swelling.  Oral mucosa pink and moist.  Dentition intact and without obvious caries or gingival swelling.  Oropharynx without erythema, exudate, or swelling.  Neck - No masses or thyromegaly or  limitation in range of motion CV: RRR, no m/r/g.   LUNGS: CTA bilat, nonlabored resps, good aeration in all lung fields. ABD: soft, mild discomfort with palpation of midepigastric region and RUQ, mild tenderness over umbillical region.  No HSM.  BS positive.  No mass or bruit. EXT: no clubbing, cyanosis, or edema.  Rectal: 1 small thrombosed, nontender hemorrhoid at 3 o'clock position, no internal mass or tenderness.  Stool wipings are very dark brown, hemoccult negative today.  LAB: Capillary Hb today: 15.0  IMPRESSION AND PLAN:  Dyspepsia Largely a matter of dietary indiscretion lately, but with his history of bleeding ulcer in the past I'll check hemoccult x 3 and CBC. Reassured pt today that his POCT hb was fine, hemoccult in office negative.  His black stool is from the pepto bismol he took  prior. Rx'd carafate 1g qid prn.   Continue omeprazole bid. D/C lamisil in case this has been giving GI upset, plus will check CMET. Since I'm drawing blood already today, will recheck TSH due to SLIGHTLY low TSH (with normal T4 and T3) back in 11/2011.  External hemorrhoid, thrombosed Asymptomatic. Reassured him.  Migraine headache without aura He seems to get these more when he's not feeling well from a GI standpoint +/- when he is more anxious about things. I RF'd his imitrex (generic) today, 100mg , #9, RF x 2.     Flu vaccine IM today.  An After Visit Summary was printed and given to the patient.   FOLLOW UP: 2 wks

## 2012-09-19 NOTE — Assessment & Plan Note (Signed)
He seems to get these more when he's not feeling well from a GI standpoint +/- when he is more anxious about things. I RF'd his imitrex (generic) today, 100mg , #9, RF x 2.

## 2012-09-19 NOTE — Assessment & Plan Note (Signed)
Asymptomatic. Reassured him.

## 2012-09-19 NOTE — Patient Instructions (Addendum)
Don't take your lamisil until I see you again in 2 wks.  Avoid advil, alleve, BC powder, Aspirin, ibuprofen, Goodies powders, and any prescription anti-inflammitory medication.  Diet for GERD or PUD Nutrition therapy can help ease the discomfort of gastroesophageal reflux disease (GERD) and peptic ulcer disease (PUD).  HOME CARE INSTRUCTIONS   Eat your meals slowly, in a relaxed setting.   Eat 5 to 6 small meals per day.   If a food causes distress, stop eating it for a period of time.  FOODS TO AVOID  Coffee, regular or decaffeinated.   Cola beverages, regular or low calorie.   Tea, regular or decaffeinated.   Pepper.   Cocoa.   High fat foods, including meats.   Butter, margarine, hydrogenated oil (trans fats).   Peppermint or spearmint (if you have GERD).   Fruits and vegetables if not tolerated.   Alcohol.   Nicotine (smoking or chewing). This is one of the most potent stimulants to acid production in the gastrointestinal tract.   Any food that seems to aggravate your condition.  If you have questions regarding your diet, ask your caregiver or a registered dietitian. TIPS  Lying flat may make symptoms worse. Keep the head of your bed raised 6 to 9 inches (15 to 23 cm) by using a foam wedge or blocks under the legs of the bed.   Do not lay down until 3 hours after eating a meal.   Daily physical activity may help reduce symptoms.  MAKE SURE YOU:   Understand these instructions.   Will watch your condition.   Will get help right away if you are not doing well or get worse.  Document Released: 12/07/2005 Document Revised: 11/26/2011 Document Reviewed: 10/23/2011 [facility] Patient Information 2012 [place], [place].

## 2012-09-21 ENCOUNTER — Ambulatory Visit: Payer: BC Managed Care – PPO | Admitting: Family Medicine

## 2012-09-28 ENCOUNTER — Encounter: Payer: Self-pay | Admitting: *Deleted

## 2012-10-05 ENCOUNTER — Ambulatory Visit: Payer: BC Managed Care – PPO | Admitting: Family Medicine

## 2012-10-12 ENCOUNTER — Ambulatory Visit: Payer: BC Managed Care – PPO | Admitting: Family Medicine

## 2012-11-15 ENCOUNTER — Ambulatory Visit (INDEPENDENT_AMBULATORY_CARE_PROVIDER_SITE_OTHER): Payer: BC Managed Care – PPO | Admitting: Family Medicine

## 2012-11-15 ENCOUNTER — Encounter: Payer: Self-pay | Admitting: Family Medicine

## 2012-11-15 VITALS — BP 138/92 | HR 77 | Temp 98.6°F | Ht 68.0 in | Wt 184.8 lb

## 2012-11-15 DIAGNOSIS — J40 Bronchitis, not specified as acute or chronic: Secondary | ICD-10-CM

## 2012-11-15 HISTORY — DX: Bronchitis, not specified as acute or chronic: J40

## 2012-11-15 MED ORDER — PROBIOTIC PRODUCT PO CHEW: CHEWABLE_TABLET | ORAL | Status: DC

## 2012-11-15 MED ORDER — HYDROCOD POLST-CHLORPHEN POLST 10-8 MG/5ML PO LQCR: 5.0000 mL | Freq: Every evening | ORAL | Status: DC | PRN

## 2012-11-15 MED ORDER — AMOXICILLIN-POT CLAVULANATE 875-125 MG PO TABS: 1.0000 | ORAL_TABLET | Freq: Two times a day (BID) | ORAL | Status: DC

## 2012-11-15 NOTE — Patient Instructions (Addendum)

## 2012-11-15 NOTE — Progress Notes (Signed)
Patient ID: Juan Meyer, male   DOB: 1971-12-03, 41 y.o.   MRN: [id number] Juan Meyer [id number] [birth year] 11/15/2012      Progress Note-Follow Up  Subjective  Chief Complaint  Chief Complaint  Patient presents with  . URI    cough w/ phlegm (yellowish brown), chest congestion, headaches, body aches ears were hurting but feel better now X 2 weeks    HPI  Patient is a 41 year old Caucasian male who is in today with 2 weeks worth of respiratory symptoms. He is struggling with both head congestion and chest congestion. Says his cough is productive of yellowish brown phlegm cough often keeps him awake has malaise and myalgias as well.-Sinus pressure but no sore throat or rhinorrhea. Has some low-grade palpitations but no chest pain. No ear pain has had very infrequent fevers. No GI or GU complaints.  Past Medical History  Diagnosis Date  . Ulcer 06/2010    NSAID-induced.  Duodenal bulb.  +GI bleed.  . Esophageal stricture 2011  . Duodenal stricture 2011  . Chronic neck pain     s/p falling off scaffold 1997.  This is better now.  [name] GERD (gastroesophageal reflux disease)   . Hyperlipidemia   . Migraine headache   . Bronchitis 11/15/2012    No past surgical history on file.  No family history on file.  History   Social History  . Marital Status: Married    Spouse Name: [name]    Number of Children: N/A  . Years of Education: N/A   Occupational History  . Maintenance Plant Mgr    Social History Main Topics  . Smoking status: Never Smoker   . Smokeless tobacco: Never Used  . Alcohol Use: No  . Drug Use: No  . Sexually Active: Not on file   Other Topics Concern  . 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  No T/A/Ds.  Lots of dust exposure at work but no other potential lung irritants/carcinogens.Exercised 5d/week up until 6 mo ago.  Now does this less.    Current Outpatient  Prescriptions on File Prior to Visit  Medication Sig Dispense Refill  . citalopram (CELEXA) 40 MG tablet Take 1 tablet (40 mg total) by mouth daily.  30 tablet  3  . mometasone (NASONEX) 50 MCG/ACT nasal spray 2 sprays in each nostril once each morning  17 g  6  . omeprazole (PRILOSEC) 20 MG capsule Take 1 capsule (20 mg total) by mouth daily.  30 capsule  6  . sucralfate (CARAFATE) 1 G tablet Take 1 tablet (1 g total) by mouth 4 (four) times daily.  40 tablet  1  . SUMAtriptan (IMITREX) 100 MG tablet 1/2-1 tab po at onset of headache.  May repeat dose in 2 hours if HA is not 50% improved.  Max dose in 24h is 200 mg.  9 tablet  2  . [DISCONTINUED] PARoxetine (PAXIL) 20 MG tablet Take 1 tablet (20 mg total) by mouth every morning.  30 tablet  1    No Known Allergies  Review of Systems  Review of Systems  Constitutional: Positive for fever and malaise/fatigue.  HENT: Positive for ear pain, congestion and sore throat.   Eyes: Negative for discharge.  Respiratory: Positive for cough, sputum production and shortness of breath. Negative for wheezing.   Cardiovascular: Negative for chest pain, palpitations and leg swelling.  Gastrointestinal: Negative for nausea, abdominal pain and  diarrhea.  Genitourinary: Negative for dysuria.  Musculoskeletal: Negative for falls.  Skin: Negative for rash.  Neurological: Negative for loss of consciousness and headaches.  Endo/Heme/Allergies: Negative for polydipsia.  Psychiatric/Behavioral: Negative for depression and suicidal ideas. The patient is not nervous/anxious and does not have insomnia.     Objective  BP 138/92  Pulse 77  Temp 98.6 F (37 C) (Temporal)  Ht 5\' 8"  (1.727 m)  Wt 184 lb 12.8 oz (83.825 kg)  BMI 28.10 kg/m2  SpO2 97%  Physical Exam  Physical Exam  Constitutional: He is oriented to person, place, and time and well-developed, well-nourished, and in no distress. No distress.  HENT:  Head: Normocephalic and atraumatic.  Eyes:  Conjunctivae normal are normal.  Neck: Neck supple. No thyromegaly present.  Cardiovascular: Normal rate, regular rhythm and normal heart sounds.   No murmur heard. Pulmonary/Chest: Effort normal. No respiratory distress.       Rhonchi noted b/l bases  Abdominal: He exhibits no distension and no mass. There is no tenderness.  Musculoskeletal: He exhibits no edema.  Neurological: He is alert and oriented to person, place, and time.  Skin: Skin is warm.  Psychiatric: Memory, affect and judgment normal.    Lab Results  Component Value Date   TSH 1.75 09/19/2012   Lab Results  Component Value Date   WBC 7.6 09/19/2012   HGB 16.0 09/19/2012   HCT 47.9 09/19/2012   MCV 91.1 09/19/2012   PLT 195.0 09/19/2012   Lab Results  Component Value Date   CREATININE 1.0 09/19/2012   BUN 15 09/19/2012   NA 140 09/19/2012   K 4.3 09/19/2012   CL 104 09/19/2012   CO2 27 09/19/2012   Lab Results  Component Value Date   ALT 20 09/19/2012   AST 20 09/19/2012   ALKPHOS 92 09/19/2012   BILITOT 0.7 09/19/2012   Lab Results  Component Value Date   CHOL 169 11/25/2011   Lab Results  Component Value Date   HDL 35.00* 11/25/2011   Lab Results  Component Value Date   LDLCALC 120* 11/25/2011   Lab Results  Component Value Date   TRIG 68.0 11/25/2011   Lab Results  Component Value Date   CHOLHDL 5 11/25/2011     Assessment & Plan  Bronchitis 2 weeks worth of symptoms, encouraged increased rest and fluids, start Augmentin and Mucinex, given Tussionex to use prn report if no improvement

## 2012-11-15 NOTE — Assessment & Plan Note (Signed)
2 weeks worth of symptoms, encouraged increased rest and fluids, start Augmentin and Mucinex, given Tussionex to use prn report if no improvement

## 2012-11-16 ENCOUNTER — Encounter: Payer: Self-pay | Admitting: Family Medicine

## 2012-11-28 ENCOUNTER — Other Ambulatory Visit: Payer: Self-pay | Admitting: Family Medicine

## 2012-11-29 NOTE — Telephone Encounter (Signed)
OK to RF as requested (#30, RF x 2.  However, prior to any FURTHER rf's he'll need routine office f/u for anxiety.--thx

## 2012-11-29 NOTE — Telephone Encounter (Signed)
Please advise refills 

## 2012-11-29 NOTE — Telephone Encounter (Signed)
Faxed to pharmacy

## 2012-11-29 NOTE — Telephone Encounter (Signed)
eScribe request for refill on DIAZEPAM Last filled -  Last seen on - 08/2012 Follow up - 2 WEEKS Please advise refills.

## 2013-01-18 ENCOUNTER — Emergency Department (HOSPITAL_COMMUNITY): Payer: BC Managed Care – PPO

## 2013-01-18 ENCOUNTER — Encounter (HOSPITAL_COMMUNITY): Payer: Self-pay

## 2013-01-18 ENCOUNTER — Emergency Department (HOSPITAL_COMMUNITY)
Admission: EM | Admit: 2013-01-18 | Discharge: 2013-01-18 | Disposition: A | Discharge status: Home / Self Care | Payer: BC Managed Care – PPO | Attending: Emergency Medicine | Admitting: Emergency Medicine

## 2013-01-18 DIAGNOSIS — L98499 Non-pressure chronic ulcer of skin of other sites with unspecified severity: Secondary | ICD-10-CM | POA: Insufficient documentation

## 2013-01-18 DIAGNOSIS — K219 Gastro-esophageal reflux disease without esophagitis: Secondary | ICD-10-CM | POA: Insufficient documentation

## 2013-01-18 DIAGNOSIS — Z8709 Personal history of other diseases of the respiratory system: Secondary | ICD-10-CM | POA: Insufficient documentation

## 2013-01-18 DIAGNOSIS — R079 Chest pain, unspecified: Secondary | ICD-10-CM | POA: Insufficient documentation

## 2013-01-18 DIAGNOSIS — Z8719 Personal history of other diseases of the digestive system: Secondary | ICD-10-CM | POA: Insufficient documentation

## 2013-01-18 DIAGNOSIS — R0789 Other chest pain: Secondary | ICD-10-CM

## 2013-01-18 DIAGNOSIS — Z8639 Personal history of other endocrine, nutritional and metabolic disease: Secondary | ICD-10-CM | POA: Insufficient documentation

## 2013-01-18 DIAGNOSIS — Z862 Personal history of diseases of the blood and blood-forming organs and certain disorders involving the immune mechanism: Secondary | ICD-10-CM | POA: Insufficient documentation

## 2013-01-18 DIAGNOSIS — Z8739 Personal history of other diseases of the musculoskeletal system and connective tissue: Secondary | ICD-10-CM | POA: Insufficient documentation

## 2013-01-18 DIAGNOSIS — G43909 Migraine, unspecified, not intractable, without status migrainosus: Secondary | ICD-10-CM | POA: Insufficient documentation

## 2013-01-18 DIAGNOSIS — Z79899 Other long term (current) drug therapy: Secondary | ICD-10-CM | POA: Insufficient documentation

## 2013-01-18 LAB — BASIC METABOLIC PANEL
BUN: 13 mg/dL (ref 6–23)
Calcium: 9.5 mg/dL (ref 8.4–10.5)
GFR calc non Af Amer: 89 mL/min — ABNORMAL LOW (ref >90)
Glucose, Bld: 91 mg/dL (ref 70–99)

## 2013-01-18 LAB — CBC
HCT: 49.5 % (ref 39.0–52.0)
Hemoglobin: 17.8 g/dL — ABNORMAL HIGH (ref 13.0–17.0)
MCH: 30.9 pg (ref 26.0–34.0)
MCHC: 36 g/dL (ref 30.0–36.0)
RDW: 12.1 % (ref 11.5–15.5)

## 2013-01-18 NOTE — ED Notes (Addendum)
Pt c/o soreness to left chest that started today about an hour PTA. Denies any associated sx with pain. Denies any fever. States it is not constant but more sporadic.

## 2013-01-18 NOTE — ED Notes (Signed)
Pt resting, watching tv, no needs at this time 

## 2013-01-18 NOTE — ED Provider Notes (Signed)
History     CSN: [id number]  Arrival date & time 01/18/13  1127   First MD Initiated Contact with Patient 01/18/13 1142      Chief Complaint  Patient presents with  . Chest Pain    (Consider location/radiation/quality/duration/timing/severity/associated sxs/prior treatment) HPI Comments: 42 y.o. Male presents today complaining of acute onset chest pain that started a few hours ago. Pt states he has had this pain about once a month for the last 6 months. The last time he had this pain, he went to his PCP who treated him for anxiety. He describes the pain as achy, left-sided, and radiates to the right. The pain is always the same and it comes and goes. Pt describes being under a lot of stress for the last 6 months. Severity is 7/10. It is not positional and nothing makes it better or worse. Pt has taken no interventions. Pt denies fever, trauma, shortness of breath, nausea/vomiting, numbness/tingling, change in bowel habits, diaphoresis, or abdominal pain. PMHx significant for duodenal ulcer.    Patient is a 42 y.o. male presenting with chest pain.  Chest Pain Pertinent negatives for primary symptoms include no fever, no shortness of breath, no palpitations, no nausea, no vomiting and no dizziness.  Pertinent negatives for associated symptoms include no diaphoresis, no numbness and no weakness.     Past Medical History  Diagnosis Date  . Ulcer 06/2010    NSAID-induced.  Duodenal bulb.  +GI bleed.  . Esophageal stricture 2011  . Duodenal stricture 2011  . Chronic neck pain     s/p falling off scaffold 1997.  This is better now.  [name] GERD (gastroesophageal reflux disease)   . Hyperlipidemia   . Migraine headache   . Bronchitis 11/15/2012    Past Surgical History  Procedure Date  . Esophagogastroduodenoscopy     No family history on file.  History  Substance Use Topics  . Smoking status: Never Smoker   . Smokeless tobacco: Never Used  . Alcohol Use: No      Review of  Systems  Constitutional: Negative for fever and diaphoresis.  HENT: Negative for neck pain and neck stiffness.   Eyes: Negative for visual disturbance.  Respiratory: Negative for apnea, chest tightness and shortness of breath.   Cardiovascular: Positive for chest pain. Negative for palpitations.  Gastrointestinal: Negative for nausea, vomiting, diarrhea and constipation.  Genitourinary: Negative for dysuria.  Musculoskeletal: Negative for gait problem.  Skin: Negative for rash.  Neurological: Negative for dizziness, weakness, light-headedness, numbness and headaches.    Allergies  Review of patient's allergies indicates no known allergies.  Home Medications   Current Outpatient Rx  Name  Route  Sig  Dispense  Refill  . ACETAMINOPHEN 500 MG PO TABS   Oral   Take 1,000 mg by mouth every 6 (six) hours as needed. For pain         . CITALOPRAM HYDROBROMIDE 40 MG PO TABS   Oral   Take 20 mg by mouth daily.         [name] DIAZEPAM 5 MG PO TABS   Oral   Take 5-10 mg by mouth every 12 (twelve) hours as needed. For anxiety         . OMEPRAZOLE 20 MG PO CPDR   Oral   Take 40 mg by mouth daily.         . SUCRALFATE 1 G PO TABS   Oral   Take 1 g by mouth 4 (four) times daily.         [name]  SUMATRIPTAN SUCCINATE 100 MG PO TABS   Oral   Take 100 mg by mouth every 2 (two) hours as needed. For migraines           BP 137/90  Pulse 85  Temp 97.7 F (36.5 C) (Oral)  Resp 18  Ht 5\' 8"  (1.727 m)  Wt 180 lb (81.647 kg)  BMI 27.37 kg/m2  SpO2 98%  Physical Exam  Vitals reviewed. Constitutional: He is oriented to person, place, and time. He appears well-developed and well-nourished. No distress.  HENT:  Head: Normocephalic and atraumatic.  Eyes: Conjunctivae normal and EOM are normal.  Neck: Normal range of motion. Neck supple.       No meningeal signs  Cardiovascular: Normal rate, regular rhythm and normal heart sounds.  Exam reveals no gallop and no friction rub.   No  murmur heard. Pulmonary/Chest: Effort normal and breath sounds normal. No respiratory distress. He has no wheezes. He has no rales. He exhibits no tenderness.  Abdominal: Soft. Bowel sounds are normal. He exhibits no distension. There is no tenderness. There is no rebound and no guarding.  Musculoskeletal: Normal range of motion. He exhibits no edema and no tenderness.  Neurological: He is alert and oriented to person, place, and time. No cranial nerve deficit.  Skin: Skin is warm and dry. He is not diaphoretic. No erythema.    ED Course  Procedures (including critical care time)  Labs Reviewed  CBC - Abnormal; Notable for the following:    Hemoglobin 17.8 (*)     All other components within normal limits  POCT I-STAT TROPONIN I  BASIC METABOLIC PANEL   Dg Chest 2 View  01/18/2013  *RADIOLOGY REPORT*  Clinical Data: Chest pain.  CHEST - 2 VIEW  Comparison: July 04, 2011.  Findings: Cardiomediastinal silhouette appears normal.  No acute pulmonary disease is noted.  Bony thorax is intact.  IMPRESSION: No acute cardiopulmonary abnormality seen.   Original Report Authenticated By: Lupita Raider.,  M.D.     Results for orders placed during the hospital encounter of 01/18/13  CBC      Component Value Range   WBC 6.8  4.0 - 10.5 K/uL   RBC 5.76  4.22 - 5.81 MIL/uL   Hemoglobin 17.8 (*) 13.0 - 17.0 g/dL   HCT 16.1  09.6 - 04.5 %   MCV 85.9  78.0 - 100.0 fL   MCH 30.9  26.0 - 34.0 pg   MCHC 36.0  30.0 - 36.0 g/dL   RDW 40.9  81.1 - 91.4 %   Platelets 196  150 - 400 K/uL  BASIC METABOLIC PANEL      Component Value Range   Sodium 140  135 - 145 mEq/L   Potassium 3.9  3.5 - 5.1 mEq/L   Chloride 101  96 - 112 mEq/L   CO2 25  19 - 32 mEq/L   Glucose, Bld 91  70 - 99 mg/dL   BUN 13  6 - 23 mg/dL   Creatinine, Ser 7.82  0.50 - 1.35 mg/dL   Calcium 9.5  8.4 - 95.6 mg/dL   GFR calc non Af Amer 89 (*) >90 mL/min   GFR calc Af Amer >90  >90 mL/min  POCT I-STAT TROPONIN I      Component  Value Range   Troponin i, poc 0.00  0.00 - 0.08 ng/mL   Comment 3              Date: 01/18/2013  Rate:  96  Rhythm: normal sinus rhythm QRS Axis: 90  Intervals: normal  ST/T Wave abnormalities: normal  Conduction Disutrbances: none  Narrative Interpretation: Normal EKG  Old EKG Reviewed: None available 1. Chest pain, non-cardiac     MDM  Ruled out ACS: troponins negative x1, normal EKG, chest xray normal. Ruled out infection: pt afebrile, labs unremarkable. At this time there does not appear to be any evidence of an acute emergency medical condition and the patient appears stable for discharge with appropriate outpatient follow up. Diagnosis and test results were  discussed with patient who verbalizes understanding and is agreeable to discharge. Pt case discussed and pt seen by Dr. Jeraldine Loots who agrees with my plan.   Directed pt to make appointment with PCP to follow up with her persistent hypertension during his ED visit. Provided information on the DASH diet.   Glade Nurse, PA-C 01/18/13 2150

## 2013-01-19 NOTE — ED Provider Notes (Signed)
This was assured encounter.  On my exam the patient's pain had resolved entirely.  He describes a long history of similar pain.  The patient has been evaluated with his primary care physician, and absent acute findings today, with a nonischemic EKG, he was discharged in stable condition to follow up with his physician.  Gerhard Munch, MD 01/19/13 [id number]

## 2013-02-21 ENCOUNTER — Other Ambulatory Visit: Payer: Self-pay | Admitting: Family Medicine

## 2013-02-21 NOTE — Telephone Encounter (Signed)
Last filed 07/27/12, #30 x 3 Last seen 11/15/12 30 day supply sent

## 2013-03-23 ENCOUNTER — Encounter: Payer: Self-pay | Admitting: Family Medicine

## 2013-03-23 ENCOUNTER — Ambulatory Visit (INDEPENDENT_AMBULATORY_CARE_PROVIDER_SITE_OTHER): Payer: BC Managed Care – PPO | Admitting: Family Medicine

## 2013-03-23 VITALS — BP 120/86 | HR 76 | Temp 99.4°F | Ht 68.0 in | Wt 185.0 lb

## 2013-03-23 DIAGNOSIS — R51 Headache: Secondary | ICD-10-CM

## 2013-03-23 MED ORDER — PROMETHAZINE HCL 25 MG PO TABS: 25.0000 mg | ORAL_TABLET | Freq: Three times a day (TID) | ORAL | Status: AC | PRN

## 2013-03-23 MED ORDER — AZITHROMYCIN 250 MG PO TABS: ORAL_TABLET | ORAL | Status: DC

## 2013-03-23 MED ORDER — CLONAZEPAM 1 MG PO TABS: ORAL_TABLET | ORAL | Status: DC

## 2013-03-23 MED ORDER — RIZATRIPTAN BENZOATE 10 MG PO TABS: 10.0000 mg | ORAL_TABLET | ORAL | Status: DC | PRN

## 2013-03-23 MED ORDER — FLUTICASONE PROPIONATE 50 MCG/ACT NA SUSP: NASAL | Status: DC

## 2013-03-23 NOTE — Progress Notes (Signed)
OFFICE NOTE  03/23/2013  CC:  Chief Complaint  Patient presents with  . Sinusitis    face/teeth pain     HPI: Patient is a 42 y.o. Caucasian male who is here for face pain and headache. Describes 1-2 wks of pain in paranasal sinuses, upper jaw, upper teeth diffusely, and a generalized HA.   Some subjective "low grade" fever.  + nausea without vomiting.  Occ feeling of being dizzy/lightheaded. No nasal mucous, nasal congestion, PND, or sneezing.  No eye itching or watering or swelling.  No cough.  He feels worse laying supine.  Zyrtec and sudafed use recently --no help. He seems to feel like this is not like his past migraines.  He does not know if he grinds his teeth in his sleep.  He doesn't know whether or not he clenches his teeth a lot in daytime.  No visual sx's, no sx's to suggest an aura.  Pertinent PMH:  Past Medical History  Diagnosis Date  . Ulcer 06/2010    NSAID-induced.  Duodenal bulb.  +GI bleed.  . Esophageal stricture 2011  . Duodenal stricture 2011  . Chronic neck pain     s/p falling off scaffold 1997.  This is better now.  [name] GERD (gastroesophageal reflux disease)   . Hyperlipidemia   . Migraine headache   . Bronchitis 11/15/2012    MEDS:  Outpatient Prescriptions Prior to Visit  Medication Sig Dispense Refill  . acetaminophen (TYLENOL) 500 MG tablet Take 1,000 mg by mouth every 6 (six) hours as needed. For pain      . diazepam (VALIUM) 5 MG tablet Take 5-10 mg by mouth every 12 (twelve) hours as needed. For anxiety      . omeprazole (PRILOSEC) 20 MG capsule Take 20 mg by mouth 2 (two) times daily.       . sucralfate (CARAFATE) 1 G tablet Take 1 g by mouth 4 (four) times daily as needed.       . SUMAtriptan (IMITREX) 100 MG tablet Take 100 mg by mouth every 2 (two) hours as needed. For migraines      . citalopram (CELEXA) 40 MG tablet TAKE 1 TABLET (40 MG TOTAL) BY MOUTH DAILY.  30 tablet  0  . citalopram (CELEXA) 40 MG tablet Take 20 mg by mouth daily.        No facility-administered medications prior to visit.    PE: Blood pressure 120/86, pulse 76, temperature 99.4 F (37.4 C), temperature source Temporal, height 5\' 8"  (1.727 m), weight 185 lb (83.915 kg). Gen: Alert, well appearing.  Patient is oriented to person, place, time, and situation. ENT: Ears: EACs clear, normal epithelium.  TMs with good light reflex and landmarks bilaterally.  Eyes: no injection, icteris, swelling, or exudate.  EOMI, PERRLA. Nose: no drainage but he has mild nasal turbinate edema. No injection or focal lesion.  Mouth: lips without lesion/swelling.  Oral mucosa pink and moist.  Dentition intact and without obvious caries or gingival swelling.  No teeth sensitivity to touch today.  He does have diffuse paranasal sinus tenderness as well as mild tenderness to TMJ's bilat, mild pain with jaw opening closing.  No facial swelling.  Oropharynx without erythema, exudate, or swelling.  Neck - No masses or thyromegaly or limitation in range of motion CV: RRR, no m/r/g.   LUNGS: CTA bilat, nonlabored resps, good aeration in all lung fields. Neuro: CN 2-12 intact grossly bilat.  No focal deficits, no tremor, no ataxia.   IMPRESSION  AND PLAN:  Head and face pain Acute sinusitis vs atypical migraine HA, vs TMJ arthralgai vs multifactorial. Azithromycin x 5d, flonase qd rx'd. If not improving in a few days, try generic maxalt 10mg .  Therapeutic expectations and side effect profile of medication discussed today.  Patient's questions answered. Phenergan q6h prn rx'd--imitrex makes him very nauseated.  Now he'll have this on hand in case maxalt makes him feel this same way.   I gave him a handout of TMJ exercises and discussed trial of use of OTC mouthpiece during sleep.   An After Visit Summary was printed and given to the patient.  FOLLOW UP: prn

## 2013-03-26 ENCOUNTER — Encounter: Payer: Self-pay | Admitting: Family Medicine

## 2013-03-26 MED ORDER — CITALOPRAM HYDROBROMIDE 40 MG PO TABS: 20.0000 mg | ORAL_TABLET | Freq: Every day | ORAL | Status: DC

## 2013-03-26 NOTE — Assessment & Plan Note (Signed)
Acute sinusitis vs atypical migraine HA, vs TMJ arthralgai vs multifactorial. Azithromycin x 5d, flonase qd rx'd. If not improving in a few days, try generic maxalt 10mg .  Therapeutic expectations and side effect profile of medication discussed today.  Patient's questions answered. Phenergan q6h prn rx'd--imitrex makes him very nauseated.  Now he'll have this on hand in case maxalt makes him feel this same way.   I gave him a handout of TMJ exercises and discussed trial of use of OTC mouthpiece during sleep.

## 2013-05-30 ENCOUNTER — Encounter: Payer: Self-pay | Admitting: Family Medicine

## 2013-05-30 ENCOUNTER — Ambulatory Visit (INDEPENDENT_AMBULATORY_CARE_PROVIDER_SITE_OTHER): Payer: BC Managed Care – PPO | Admitting: Family Medicine

## 2013-05-30 VITALS — BP 120/80 | HR 78 | Temp 98.1°F | Ht 68.0 in | Wt 183.8 lb

## 2013-05-30 DIAGNOSIS — M722 Plantar fascial fibromatosis: Secondary | ICD-10-CM | POA: Insufficient documentation

## 2013-05-30 DIAGNOSIS — B351 Tinea unguium: Secondary | ICD-10-CM

## 2013-05-30 MED ORDER — TERBINAFINE HCL 250 MG PO TABS: ORAL_TABLET | ORAL | Status: DC

## 2013-05-30 MED ORDER — RIZATRIPTAN BENZOATE 10 MG PO TABS: 10.0000 mg | ORAL_TABLET | ORAL | Status: AC | PRN

## 2013-05-30 NOTE — Progress Notes (Signed)
OFFICE NOTE  05/30/2013  CC:  Chief Complaint  Juan Meyer presents with  . Foot Pain     HPI: Juan Meyer is a 42 y.o. Caucasian male who is here for heel pain. Left heel pain, both sides of heel and on bottom, no hx of injury.  Occuring infrequently x 24mo, then the last month has occurred pretty much daily.   First steps in mornings is the worst, lately even hurting after a day of work when he is just laying in bed --non wt-bearing.  No meds taken.  Heat has been applied, no help.  Recent new boots have helped, plus he put arch supports in.  No heel lift.  Also says past oral antifungal for toe fungus helped significantly--he stopped it after 1 mo.  He now notes that he still has some white thickening under 4th toenail on both feet, asks to be rx'd lamisil again. Tolerated lamisil w/out side effect in the past.  Pertinent PMH:  Past Medical History  Diagnosis Date  . Ulcer 06/2010    NSAID-induced.  Duodenal bulb.  +GI bleed.  . Esophageal stricture 2011  . Duodenal stricture 2011  . Chronic neck pain     s/p falling off scaffold 1997.  This is better now.  [name] GERD (gastroesophageal reflux disease)   . Hyperlipidemia   . Migraine headache   . Bronchitis 11/15/2012   Past surgical, social, and family history reviewed and no changes noted since last office visit.  MEDS:  Outpatient Prescriptions Prior to Visit  Medication Sig Dispense Refill  . acetaminophen (TYLENOL) 500 MG tablet Take 1,000 mg by mouth every 6 (six) hours as needed. For pain      . citalopram (CELEXA) 40 MG tablet Take 0.5 tablets (20 mg total) by mouth daily.  15 tablet  5  . clonazePAM (KLONOPIN) 1 MG tablet 1-2 tabs po qhs prn insomnia  60 tablet  1  . fluticasone (FLONASE) 50 MCG/ACT nasal spray 2 sprays each nostril once daily  16 g  6  . omeprazole (PRILOSEC) 20 MG capsule Take 20 mg by mouth 2 (two) times daily.       . promethazine (PHENERGAN) 25 MG tablet Take 1 tablet (25 mg total) by mouth every 8  (eight) hours as needed for nausea.  30 tablet  1  . rizatriptan (MAXALT) 10 MG tablet Take 1 tablet (10 mg total) by mouth as needed for migraine. May repeat in 2 hours if needed  9 tablet  1  . sucralfate (CARAFATE) 1 G tablet Take 1 g by mouth 4 (four) times daily as needed.       [name] azithromycin (ZITHROMAX) 250 MG tablet 2 tabs po qd x 1d, then 1 tab po qd x 4d  6 each  0   No facility-administered medications prior to visit.  **Not taking zithromax listed above  PE: Blood pressure 120/80, pulse 78, temperature 98.1 F (36.7 C), temperature source Oral, height 5\' 8"  (1.727 m), weight 183 lb 12 oz (83.348 kg), SpO2 96.00%. Gen: Alert, well appearing.  Juan Meyer is oriented to person, place, time, and situation. Left foot without swelling, erythema, or warmth.  Left heel tender posteriorly at the insertion site of achilles tendon.  Mild tenderness of medial heel surface, and significant point tenderness on plantar surface of heel at the medial calcaneal tubercle.  Minimal tenderness of plantar fascia distal to this.   Toenails: 4th toe on each foot has white substance underneath the nail.  Remainder of  nails normal other than mild yellowish discoloration.    IMPRESSION AND PLAN:  Plantar fasciitis of left foot Since pt is not an NSAIDs (hx of signif GI bleed) candidate and he has been dealing with this for > 4 mo, I have offered a local steroid injection for this and he does want to do this but wants to do it closer to the weekend so that he can rest after instead of going to work on it right away.  Rx for crutches given today, will use right after injection for a couple of days, also recommended he purchase a 1/4 inch heel insert for left shoe.  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   Onychomycosis: residual, mild.  Juan Meyer does want to take lamisil 250mg  qd again---I recommended 6 more weeks of this and gave rx  today.  FOLLOW UP: 2-3 d for injection

## 2013-05-30 NOTE — Patient Instructions (Signed)
Buy a 1/4 inch heel insert for left shoe. Ice heel as needed.

## 2013-05-30 NOTE — Assessment & Plan Note (Signed)
Since pt is not an NSAIDs (hx of signif GI bleed) candidate and he has been dealing with this for > 4 mo, I have offered a local steroid injection for this and he does want to do this but wants to do it closer to the weekend so that he can rest after instead of going to work on it right away.  Rx for crutches given today, will use right after injection for a couple of days, also recommended he purchase a 1/4 inch heel insert for left shoe.  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

## 2013-06-02 ENCOUNTER — Ambulatory Visit: Payer: BC Managed Care – PPO | Admitting: Family Medicine

## 2013-06-08 ENCOUNTER — Ambulatory Visit (INDEPENDENT_AMBULATORY_CARE_PROVIDER_SITE_OTHER): Payer: BC Managed Care – PPO | Admitting: Family Medicine

## 2013-06-08 ENCOUNTER — Encounter: Payer: Self-pay | Admitting: Family Medicine

## 2013-06-08 VITALS — BP 147/77 | HR 68 | Temp 97.2°F | Resp 18 | Ht 68.0 in | Wt 180.0 lb

## 2013-06-08 DIAGNOSIS — M722 Plantar fascial fibromatosis: Secondary | ICD-10-CM

## 2013-06-08 MED ORDER — METHYLPREDNISOLONE ACETATE 40 MG/ML IJ SUSP
40.0000 mg | Freq: Once | INTRAMUSCULAR | Status: AC
  Administered 2013-06-08: 40 mg via INTRA_ARTICULAR

## 2013-06-08 MED ORDER — METHYLPREDNISOLONE ACETATE 80 MG/ML IJ SUSP: 40.0000 mg | Freq: Once | INTRAMUSCULAR | Status: DC

## 2013-06-08 MED ORDER — OXYCODONE-ACETAMINOPHEN 5-325 MG PO TABS: ORAL_TABLET | ORAL | Status: DC

## 2013-06-08 NOTE — Progress Notes (Signed)
OFFICE NOTE  06/08/2013  CC:  Chief Complaint  Patient presents with  . Plantar Fasciitis    pt wants left foot injection     HPI: Patient is a 42 y.o. Caucasian male who is here for left foot plantar fascia injection for plantar fasciitis. Reviewed procedure with pt today and he wishes to proceed.  Pertinent PMH:  Past Medical History  Diagnosis Date  . Ulcer 06/2010    NSAID-induced.  Duodenal bulb.  +GI bleed.  . Esophageal stricture 2011  . Duodenal stricture 2011  . Chronic neck pain     s/p falling off scaffold 1997.  This is better now.  [name] GERD (gastroesophageal reflux disease)   . Hyperlipidemia   . Migraine headache   . Bronchitis 11/15/2012    MEDS:  Outpatient Prescriptions Prior to Visit  Medication Sig Dispense Refill  . acetaminophen (TYLENOL) 500 MG tablet Take 1,000 mg by mouth every 6 (six) hours as needed. For pain      . citalopram (CELEXA) 40 MG tablet Take 0.5 tablets (20 mg total) by mouth daily.  15 tablet  5  . clonazePAM (KLONOPIN) 1 MG tablet 1-2 tabs po qhs prn insomnia  60 tablet  1  . fluticasone (FLONASE) 50 MCG/ACT nasal spray 2 sprays each nostril once daily  16 g  6  . omeprazole (PRILOSEC) 20 MG capsule Take 20 mg by mouth 2 (two) times daily.       . promethazine (PHENERGAN) 25 MG tablet Take 1 tablet (25 mg total) by mouth every 8 (eight) hours as needed for nausea.  30 tablet  1  . rizatriptan (MAXALT) 10 MG tablet Take 1 tablet (10 mg total) by mouth as needed for migraine. May repeat in 2 hours if needed  9 tablet  3  . terbinafine (LAMISIL) 250 MG tablet 1 tab po qd x 6 weeks  42 tablet  0  . sucralfate (CARAFATE) 1 G tablet Take 1 g by mouth 4 (four) times daily as needed.        No facility-administered medications prior to visit.    PE: Blood pressure 147/77, pulse 68, temperature 97.2 F (36.2 C), temperature source Oral, resp. rate 18, height 5\' 8"  (1.727 m), weight 180 lb (81.647 kg), SpO2 97.00%. Gen: Alert, well  appearing.  Patient is oriented to person, place, time, and situation. Left foot: tender to palpation in quarter sized area over medial tubercle of left calcaneus.    IMPRESSION AND PLAN:  1) Left foot plantar fasciitis, pt desires steroid/lidocaine injection. Pt positioned in prone position on exam table.  Used medial approach with 25g 1 and 1/2 inch needle to inject 1ml 40mg /ml depo medrol and 1 ml of 1% lidocaine w/out epi.   This was injected into the area of most tenderness near the medial tubercle of calcaneus.  Pt tolerated procedure well.  No immediate complications. Post-injection instructions given to pt: minimal wt bearing x 24h, then may ambulate wearing 1/4 inch heel insert in shoe.  Start plantar fascia stretching exercises in 2d--handout reviewed and given to patient. I gave pt Percocet 5/325, 1-2 q6h prn pain, #30, no RF.  An After Visit Summary was printed and given to the patient.  FOLLOW UP: prn

## 2013-07-27 ENCOUNTER — Ambulatory Visit (INDEPENDENT_AMBULATORY_CARE_PROVIDER_SITE_OTHER): Payer: BC Managed Care – PPO | Admitting: Family Medicine

## 2013-07-27 ENCOUNTER — Encounter: Payer: Self-pay | Admitting: Family Medicine

## 2013-07-27 VITALS — BP 142/81 | HR 74 | Temp 97.4°F | Resp 16 | Ht 68.0 in | Wt 182.0 lb

## 2013-07-27 DIAGNOSIS — M722 Plantar fascial fibromatosis: Secondary | ICD-10-CM

## 2013-07-27 MED ORDER — OXYCODONE-ACETAMINOPHEN 5-325 MG PO TABS: ORAL_TABLET | ORAL | Status: AC

## 2013-07-27 NOTE — Progress Notes (Signed)
OFFICE NOTE  07/27/2013  CC:  Chief Complaint  Patient presents with  . Foot Pain     HPI: Patient is a 42 y.o. Caucasian male who is here for foot pain.   Hurts on heel, similar to presentation about 6 wks ago but pt says he feels it some in ankle and further back on heel.  He had elimination of his pain about 6 wks ago after I did a steroid injection for left plantar fasciitis (on 06/08/13).  Had to use some percocet 1/2 tab at a time briefly but then had 2-3 weeks of pain-free interval.  Then he started having brief periods of pain in the area on and off, would take 1/2 percocet and it would go away.  Continues to wear 1/4 heel insert in shoe but sometimes still wears large, heavy boots and stands on concrete floors many hours some days.  Has been doing that recently and says that he has had 4d of on>off pain, exclusively when bearing wt on the foot.  No recent injury.  Pertinent PMH:  Past Medical History  Diagnosis Date  . Ulcer 06/2010    NSAID-induced.  Duodenal bulb.  +GI bleed.  . Esophageal stricture 2011  . Duodenal stricture 2011  . Chronic neck pain     s/p falling off scaffold 1997.  This is better now.  [name] GERD (gastroesophageal reflux disease)   . Hyperlipidemia   . Migraine headache   . Bronchitis 11/15/2012  Plantar fasciitis: he is s/p steroid injection in left foot for this 06/08/13.  MEDS:  Outpatient Prescriptions Prior to Visit  Medication Sig Dispense Refill  . acetaminophen (TYLENOL) 500 MG tablet Take 1,000 mg by mouth every 6 (six) hours as needed. For pain      . citalopram (CELEXA) 40 MG tablet Take 0.5 tablets (20 mg total) by mouth daily.  15 tablet  5  . clonazePAM (KLONOPIN) 1 MG tablet 1-2 tabs po qhs prn insomnia  60 tablet  1  . fluticasone (FLONASE) 50 MCG/ACT nasal spray 2 sprays each nostril once daily  16 g  6  . omeprazole (PRILOSEC) 20 MG capsule Take 20 mg by mouth 2 (two) times daily.       [name] oxyCODONE-acetaminophen (PERCOCET/ROXICET) 5-325  MG per tablet 1-2 tabs po q6h prn pain  30 tablet  0  . promethazine (PHENERGAN) 25 MG tablet Take 1 tablet (25 mg total) by mouth every 8 (eight) hours as needed for nausea.  30 tablet  1  . rizatriptan (MAXALT) 10 MG tablet Take 1 tablet (10 mg total) by mouth as needed for migraine. May repeat in 2 hours if needed  9 tablet  3  . sucralfate (CARAFATE) 1 G tablet Take 1 g by mouth 4 (four) times daily as needed.       . terbinafine (LAMISIL) 250 MG tablet 1 tab po qd x 6 weeks  42 tablet  0   No facility-administered medications prior to visit.    PE: Blood pressure 142/81, pulse 74, temperature 97.4 F (36.3 C), temperature source Temporal, resp. rate 16, height 5\' 8"  (1.727 m), weight 182 lb (82.555 kg), SpO2 98.00%. Gen: Alert, well appearing.  Patient is oriented to person, place, time, and situation. Left ankle and foot: no swelling or erythema.  Achilles nontender.  No calcaneal tenderness except at the area of the medial calcaneal tubercle.  Plantar surface of foot was otherwise nontender.  DP and PT pulses 2+. Ankle and foot ROM  fully intact.  Ankle nontender.  IMPRESSION AND PLAN:  Plantar fasciitis of left foot Mild flare.  He is only 6 wks out from steroid injection for this. Discussed symptomatic care again--rest, ice, fitted with post-up shoe for prn use today. No injection unless more severe symptoms. Discussed conservative use of percocet 1/2 tab q6h prn pain since tylenol does not help and he has to avoid NSAIDs. Percocet 5/325, #30, no RF rx'd today.   Pt understands that use of narcotics for this pain is a last resort and is certainly not the solution to his problem.  Reviewed the natural course of this condition--sometimes takes 6-12 mo to completely resolve.    An After Visit Summary was printed and given to the patient.  FOLLOW UP: prn

## 2013-07-27 NOTE — Assessment & Plan Note (Signed)
Mild flare.  He is only 6 wks out from steroid injection for this. Discussed symptomatic care again--rest, ice, fitted with post-up shoe for prn use today. No injection unless more severe symptoms. Discussed conservative use of percocet 1/2 tab q6h prn pain since tylenol does not help and he has to avoid NSAIDs. Percocet 5/325, #30, no RF rx'd today.

## 2013-08-28 ENCOUNTER — Other Ambulatory Visit: Payer: Self-pay | Admitting: Family Medicine

## 2013-08-28 MED ORDER — TERBINAFINE HCL 250 MG PO TABS: ORAL_TABLET | ORAL | Status: DC

## 2013-08-28 NOTE — Telephone Encounter (Signed)
Rx sent.  Pls tell pt that if nail fungus is not resolved after this 6 wk course of med then I recommend he see a dermatologist for further evaluation.-thx

## 2013-08-28 NOTE — Telephone Encounter (Signed)
Patient pharmacy requesting terbinafine refill.  Please advise.

## 2013-08-29 NOTE — Telephone Encounter (Signed)
Patient aware and will let us know if fungus does not resolve with this treatment.

## 2013-10-23 ENCOUNTER — Telehealth: Payer: Self-pay | Admitting: Family Medicine

## 2013-10-23 NOTE — Telephone Encounter (Signed)
Patient requesting refill of clonazepam.  Patient last seen 07/27/13.  Last rx printed 03/23/13 x 1 refill.  Please advise.

## 2013-10-24 MED ORDER — CLONAZEPAM 1 MG PO TABS: ORAL_TABLET | ORAL | Status: DC

## 2013-10-24 NOTE — Telephone Encounter (Signed)
OK to do rx for RF as previously prescribed, with 1 additional RF.-thx

## 2013-10-24 NOTE — Telephone Encounter (Signed)
Faxed rx to pharmacy  

## 2013-11-21 ENCOUNTER — Other Ambulatory Visit: Payer: Self-pay | Admitting: Family Medicine

## 2013-11-21 MED ORDER — CITALOPRAM HYDROBROMIDE 40 MG PO TABS: 20.0000 mg | ORAL_TABLET | Freq: Every day | ORAL | Status: DC

## 2013-11-21 NOTE — Telephone Encounter (Signed)
Patient requesting refill of citalopram.  Per [place] protocol, medication sent in with 3 refills.

## 2013-11-22 ENCOUNTER — Other Ambulatory Visit: Payer: Self-pay | Admitting: *Deleted

## 2013-11-22 MED ORDER — CITALOPRAM HYDROBROMIDE 40 MG PO TABS: 20.0000 mg | ORAL_TABLET | Freq: Every day | ORAL | Status: AC

## 2013-11-22 NOTE — Telephone Encounter (Signed)
Resent rx

## 2013-12-04 ENCOUNTER — Telehealth: Payer: Self-pay | Admitting: Family Medicine

## 2013-12-04 NOTE — Telephone Encounter (Signed)
Sorry, can't do that.

## 2013-12-04 NOTE — Telephone Encounter (Signed)
Patient feels like he has bronchitis. He cannot get off work until [employer] so he has made an appt. Can he go ahead & start on a zpack?

## 2013-12-04 NOTE — Telephone Encounter (Signed)
Please advise 

## 2013-12-05 NOTE — Telephone Encounter (Signed)
Patient aware.

## 2013-12-06 ENCOUNTER — Ambulatory Visit: Payer: BC Managed Care – PPO | Admitting: Family Medicine

## 2014-06-07 ENCOUNTER — Other Ambulatory Visit: Payer: Self-pay | Admitting: Family Medicine

## 2014-06-18 ENCOUNTER — Other Ambulatory Visit: Payer: Self-pay | Admitting: Family Medicine

## 2014-08-11 ENCOUNTER — Other Ambulatory Visit: Payer: Self-pay | Admitting: Family Medicine

## 2015-04-18 ENCOUNTER — Ambulatory Visit (INDEPENDENT_AMBULATORY_CARE_PROVIDER_SITE_OTHER): Payer: BLUE CROSS/BLUE SHIELD | Admitting: Physician Assistant

## 2015-04-18 VITALS — BP 114/66 | HR 65 | Temp 98.0°F | Resp 18 | Ht 68.0 in | Wt 180.0 lb

## 2015-04-18 DIAGNOSIS — J3089 Other allergic rhinitis: Secondary | ICD-10-CM | POA: Diagnosis not present

## 2015-04-18 DIAGNOSIS — D582 Other hemoglobinopathies: Secondary | ICD-10-CM | POA: Diagnosis not present

## 2015-04-18 DIAGNOSIS — F411 Generalized anxiety disorder: Secondary | ICD-10-CM

## 2015-04-18 DIAGNOSIS — R0981 Nasal congestion: Secondary | ICD-10-CM | POA: Diagnosis not present

## 2015-04-18 DIAGNOSIS — R0789 Other chest pain: Secondary | ICD-10-CM | POA: Diagnosis not present

## 2015-04-18 DIAGNOSIS — Z91048 Other nonmedicinal substance allergy status: Secondary | ICD-10-CM

## 2015-04-18 DIAGNOSIS — Z9109 Other allergy status, other than to drugs and biological substances: Secondary | ICD-10-CM

## 2015-04-18 LAB — POCT CBC
GRANULOCYTE PERCENT: 71.1 % (ref 37–80)
HCT, POC: 54.5 % — AB (ref 43.5–53.7)
HEMOGLOBIN: 17.9 g/dL (ref 14.1–18.1)
Lymph, poc: 2.3 (ref 0.6–3.4)
MCH, POC: 29.8 pg (ref 27–31.2)
MCHC: 32.8 g/dL (ref 31.8–35.4)
MCV: 90.9 fL (ref 80–97)
MID (CBC): 0.5 (ref 0–0.9)
MPV: 8.1 fL (ref 0–99.8)
PLATELET COUNT, POC: 186 10*3/uL (ref 142–424)
POC GRANULOCYTE: 6.8 (ref 2–6.9)
POC LYMPH PERCENT: 23.7 %L (ref 10–50)
POC MID %: 5.2 % (ref 0–12)
RBC: 6 M/uL (ref 4.69–6.13)
RDW, POC: 14.2 %
WBC: 9.5 10*3/uL (ref 4.6–10.2)

## 2015-04-18 MED ORDER — GUAIFENESIN ER 1200 MG PO TB12: 1.0000 | ORAL_TABLET | Freq: Two times a day (BID) | ORAL | Status: DC | PRN

## 2015-04-18 MED ORDER — FLUOXETINE HCL 20 MG PO TABS: 20.0000 mg | ORAL_TABLET | Freq: Every day | ORAL | Status: AC

## 2015-04-18 MED ORDER — CETIRIZINE HCL 10 MG PO TABS
10.0000 mg | ORAL_TABLET | Freq: Every day | ORAL | Status: AC
Start: 2015-04-18 — End: ?

## 2015-04-18 MED ORDER — BECLOMETHASONE DIPROPIONATE 80 MCG/ACT NA AERS: 2.0000 | INHALATION_SPRAY | Freq: Every day | NASAL | Status: AC

## 2015-04-18 MED ORDER — CLONAZEPAM 1 MG PO TABS: 1.0000 mg | ORAL_TABLET | Freq: Every evening | ORAL | Status: AC | PRN

## 2015-04-18 NOTE — Progress Notes (Signed)
[facility] [address], [place] [address] [phone]  Date:  04/18/2015   Name:  Juan Meyer   DOB:  10/16/1971   MRN:  [id number]  PCP:  [name], MD    Chief Complaint: Medication Refill; sinus congestion; and cbc   History of Present Illness:  General Wearing is a 44 y.o. very pleasant male patient who presents with the following: Patient is here today for abnormal hemoglobin, sinus congestion, refill of klonopin.  Patient reports that he gave blood at the [facility], who states that he has increased hemoglobin of 19.4.  They took significant blood from him.  He then remembered that 7 days ago, he was feeling sluggish and chest pain at his left side.  He also noticed tingling.  This was during exertion.  He denies n/v, sob, or diaphoresis.  It lasted minutes.  Rest relieved symptoms.  It was also during high stressors.  This is what prompted him to come here.    Sinus: He has been having watery eyes, nasal congestion which is generally worse at work.  He reports a lot of dust exposure.  He has no fever or general malaise.  He denies cough, sorethroat, or sob.  He reports that he used to use qnasl which helped greatly.  He has tried Claritin in the past which he could not tell whether this work.  He states that he has allergies to ragweed, dust, and others.    Anxiety: Patient states that he would like a refill of his klonopin.  He generally used at bedtime with 1 tablets, though prescribed 2.  He has been without medication for 8 months, and notes that his sleep, both getting to and staying, has worsened.  Patient states that he has been very stressed recently with work.  He is Production designer, theatre/television/film at [employer], and has a lot of pressure from employer.  He has difficulty delegating tasks, and often takes on more of the work independently.  He reports panic attack months ago, where his entire upper half felt hot.  He has 2 little girls, 8 and 9, where he has shared custody  with ex-wife.  H states that this is very stressful as well.  He denies SI/HI and does not fulfill criteria for depression.  He has tried Celexa in the past, but reports "brain zaps" which feel like sudden dizziness.  He has tried Paxil, and noticed no change, so he discontinued.  He does not have a therapist.    Patient Active Problem List   Diagnosis Date Noted  . Plantar fasciitis of left foot 05/30/2013  . Head and face pain 03/26/2013  . Bronchitis 11/15/2012  . Dyspepsia 09/19/2012  . External hemorrhoid, thrombosed 09/19/2012  . Migraine headache without aura 09/19/2012  . Health maintenance examination 07/27/2012  . Onychomycosis 07/27/2012  . Insomnia 03/01/2012  . GAD (generalized anxiety disorder) 12/08/2011  . Hyperlipidemia 11/24/2011  . Fatigue 11/24/2011  . ANEMIA, SECONDARY TO ACUTE BLOOD LOSS 01/20/2011  . DUODENAL ULCER, ACUTE, HEMORRHAGE 01/20/2011    Past Medical History  Diagnosis Date  . Ulcer 06/2010    NSAID-induced.  Duodenal bulb.  +GI bleed.  . Esophageal stricture 2011  . Duodenal stricture 2011  . Chronic neck pain     s/p falling off scaffold 1997.  This is better now.  [name] GERD (gastroesophageal reflux disease)   . Hyperlipidemia   . Migraine headache   . Bronchitis 11/15/2012    Past  Surgical History  Procedure Laterality Date  . Esophagogastroduodenoscopy      History  Substance Use Topics  . Smoking status: Never Smoker   . Smokeless tobacco: Never Used  . Alcohol Use: No    History reviewed. No pertinent family history.  No Known Allergies  Medication list has been reviewed and updated.  Current Outpatient Prescriptions on File Prior to Visit  Medication Sig Dispense Refill  . acetaminophen (TYLENOL) 500 MG tablet Take 1,000 mg by mouth every 6 (six) hours as needed. For pain    . clonazePAM (KLONOPIN) 1 MG tablet TAKE 1 TO 2 TABLETS BY MOUTH AT BEDTIME AS NEEDED 60 tablet 0  . omeprazole (PRILOSEC) 20 MG capsule Take 20 mg by  mouth 2 (two) times daily.     . promethazine (PHENERGAN) 25 MG tablet Take 1 tablet (25 mg total) by mouth every 8 (eight) hours as needed for nausea. 30 tablet 1  . rizatriptan (MAXALT) 10 MG tablet Take 1 tablet (10 mg total) by mouth as needed for migraine. May repeat in 2 hours if needed 9 tablet 3  . citalopram (CELEXA) 40 MG tablet Take 0.5 tablets (20 mg total) by mouth daily. (Patient not taking: Reported on 04/18/2015) 15 tablet 3  . fluticasone (FLONASE) 50 MCG/ACT nasal spray 2 sprays each nostril once daily (Patient not taking: Reported on 04/18/2015) 16 g 6  . oxyCODONE-acetaminophen (PERCOCET/ROXICET) 5-325 MG per tablet 1-2 tabs po q6h prn pain (Patient not taking: Reported on 04/18/2015) 30 tablet 0  . sucralfate (CARAFATE) 1 G tablet Take 1 g by mouth 4 (four) times daily as needed.     . terbinafine (LAMISIL) 250 MG tablet 1 tab po qd x 6 weeks (Patient not taking: Reported on 04/18/2015) 42 tablet 0  . [DISCONTINUED] PARoxetine (PAXIL) 20 MG tablet Take 1 tablet (20 mg total) by mouth every morning. 30 tablet 1   No current facility-administered medications on file prior to visit.    Review of Systems: ROS otherwise unremarkable unless listed above.  Physical Examination: Filed Vitals:   04/18/15 1151  BP: 114/66  Pulse: 65  Temp: 98 F (36.7 C)  Resp: 18   Filed Vitals:   04/18/15 1151  Height:  (1.727 m)  Weight: 180 lb (81.647 kg)   Body mass index is 27.38 kg/(m^2). Ideal Body Weight: Weight in (lb) to have BMI = 25: 164.1  Physical Exam  Constitutional: He appears well-developed and well-nourished. No distress.  HENT:  Head: Normocephalic and atraumatic.  Right Ear: Tympanic membrane, external ear and ear canal normal.  Left Ear: Tympanic membrane, external ear and ear canal normal.  Nose: Mucosal edema present. No rhinorrhea. Right sinus exhibits no maxillary sinus tenderness and no frontal sinus tenderness. Left sinus exhibits no maxillary sinus  tenderness and no frontal sinus tenderness.  Mouth/Throat: Oropharynx is clear and moist. No oropharyngeal exudate, posterior oropharyngeal edema or posterior oropharyngeal erythema.  Eyes: Conjunctivae and EOM are normal. Pupils are equal, round, and reactive to light. Right conjunctiva is not injected. Left conjunctiva is not injected.  Lymphadenopathy:       Head (right side): No tonsillar, no preauricular and no posterior auricular adenopathy present.       Head (left side): No tonsillar, no preauricular and no posterior auricular adenopathy present.    He has no cervical adenopathy.  Skin: He is not diaphoretic.  Psychiatric: He has a normal mood and affect. His speech is normal and behavior is normal.  Results for orders placed or performed in visit on 04/18/15  POCT CBC  Result Value Ref Range   WBC 9.5 4.6 - 10.2 K/uL   Lymph, poc 2.3 0.6 - 3.4   POC LYMPH PERCENT 23.7 10 - 50 %L   MID (cbc) 0.5 0 - 0.9   POC MID % 5.2 0 - 12 %M   POC Granulocyte 6.8 2 - 6.9   Granulocyte percent 71.1 37 - 80 %G   RBC 6.00 4.69 - 6.13 M/uL   Hemoglobin 17.9 14.1 - 18.1 g/dL   HCT, POC 16.154.5 (A) 09.643.5 - 53.7 %   MCV 90.9 80 - 97 fL   MCH, POC 29.8 27 - 31.2 pg   MCHC 32.8 31.8 - 35.4 g/dL   RDW, POC 04.514.2 %   Platelet Count, POC 186 142 - 424 K/uL   MPV 8.1 0 - 99.8 fL     Assessment and Plan: 44 year old male is here today for chief complaint of sinus congestion, refill of klonopin, and to recheck blood. Other chest pain - Plan: EKG 12-Lead, POCT CBC, D-dimer, quantitative -[facility], who states that the ekg is not significant and not requiring further work up, though if we wanted, he can have a consult.    Elevated hemoglobin - Plan: POCT CBC -This is not significant from previous records, the hemoglobin is only mildly elevated.  Advised to give blood annually.    Nasal congestion - Plan: cetirizine (ZYRTEC) 10 MG tablet Environmental and  seasonal allergies - Plan: cetirizine (ZYRTEC) 10 MG tablet, Beclomethasone Dipropionate 80 MCG/ACT AERS -Advised to take the zyrtec, or other 2nd gen anti-histamine with exposure to allergies.  QNasl can be used for the next 7 days to decrease rhinitis, but then used only for allergy symptoms that are not resolved with the zyrtec.    Generalized anxiety disorder - Plan: clonazepam (KLONOPIN) 1 MG tablet, Ambulatory referral to Psychology, Fluoxetine (PROZAC) 20 MG tablet -Agreed to see psychologist.  Referred to Dwan BoltErnest McCoy -Advised starting 10 mg for 1 week, and increase to 20 mg thereafter.   -rtc for recheck in 4-6 weeks, and possible adjustment of meds -Continued klonopin for nighttime use only.  I have advised of drug dependence and slow progression of SSRI efficacy  Trena PlattStephanie Jibran Crookshanks, PA-C [facility] 4/29/20168:10 AM

## 2015-04-18 NOTE — Patient Instructions (Signed)
Remember, the prozac is a slow acting medication.  So be patient with the process.   Please await scheduling from cardiology and the psychotherapy.

## 2015-06-10 ENCOUNTER — Other Ambulatory Visit: Payer: Self-pay

## 2015-06-10 DIAGNOSIS — F411 Generalized anxiety disorder: Secondary | ICD-10-CM

## 2015-06-10 NOTE — Telephone Encounter (Signed)
Pharm reqs RF of clonazepam. Pended. 

## 2015-06-10 NOTE — Telephone Encounter (Signed)
He is to return to the clinic for recheck of his prozac, and to see about medication adjustment.  He was also referred to psychologist at this time.  He needs to return for recheck.  He needs to return to clinic for recheck.  He can definitely set up an appointment.  --And if he is using 1 tablet, at night, he should not be out at this time.

## 2015-06-13 ENCOUNTER — Other Ambulatory Visit: Payer: Self-pay

## 2015-06-13 DIAGNOSIS — F411 Generalized anxiety disorder: Secondary | ICD-10-CM

## 2015-06-13 NOTE — Telephone Encounter (Signed)
Denied w/message that pt needs to RTC for f/up per plan at last OV.

## 2015-06-13 NOTE — Telephone Encounter (Signed)
Pharm reqs RF of clonazepam. Pended. 

## 2015-06-14 NOTE — Telephone Encounter (Signed)
So sorry, routed in error. (This is a duplicate and has already been addressed on prev message).

## 2015-10-23 ENCOUNTER — Ambulatory Visit: Payer: Worker's Compensation

## 2015-10-23 ENCOUNTER — Telehealth: Payer: Self-pay

## 2015-10-23 ENCOUNTER — Ambulatory Visit (INDEPENDENT_AMBULATORY_CARE_PROVIDER_SITE_OTHER): Payer: BLUE CROSS/BLUE SHIELD | Admitting: Family Medicine

## 2015-10-23 VITALS — BP 120/84 | HR 73 | Temp 98.4°F | Resp 16 | Ht 68.0 in | Wt 178.0 lb

## 2015-10-23 DIAGNOSIS — M79645 Pain in left finger(s): Secondary | ICD-10-CM | POA: Diagnosis not present

## 2015-10-23 DIAGNOSIS — S6992XA Unspecified injury of left wrist, hand and finger(s), initial encounter: Secondary | ICD-10-CM

## 2015-10-23 MED ORDER — PROMETHAZINE HCL 12.5 MG PO TABS: 12.5000 mg | ORAL_TABLET | Freq: Four times a day (QID) | ORAL | Status: AC | PRN

## 2015-10-23 MED ORDER — HYDROCODONE-ACETAMINOPHEN 5-325 MG PO TABS: 1.0000 | ORAL_TABLET | Freq: Four times a day (QID) | ORAL | Status: AC | PRN

## 2015-10-23 NOTE — Telephone Encounter (Signed)
Per employer request, by Janese Banksheresa Nicol, a receipt and Patient Access Request form are being mail to [address] [place], [place] [id number] in order to document service of 10/23/15 provided to employee: Juan Meyer.  Mailed sent out for processing: 10/23/2015

## 2015-10-23 NOTE — Progress Notes (Addendum)
Subjective:     Patient ID: Juan Meyer, male   DOB: 1971-03-21, 44 y.o.   MRN: [id number]  HPI Patient presents for index finger laceration of left hand following hitting finger when hammer while trying to hammer a baring today. Pain does not radiate and states that when initially happened had numbness at tip of finger, but sensation has since returned. Denies loss of ROM. Cleaned finger initially with "what was in first aid kit." NKDA. Tetanus vaccine given 6 months ago with different injury.  Review of Systems As noted above.    Objective:   Physical Exam  Constitutional: He is oriented to person, place, and time. He appears well-developed and well-nourished. No distress.  Blood pressure 120/84, pulse 73, temperature 98.4 F (36.9 C), temperature source Oral, resp. rate 16, height _0  (1.727 m), weight 178 lb (80.74 kg), SpO2 98 %.   HENT:  Head: Normocephalic and atraumatic.  Right Ear: External ear normal.  Left Ear: External ear normal.  Eyes: Conjunctivae are normal. Right eye exhibits no discharge. Left eye exhibits no discharge. No scleral icterus.  Pulmonary/Chest: Effort normal.  Musculoskeletal:       Left hand: He exhibits laceration and swelling. He exhibits normal range of motion, no tenderness, no bony tenderness and normal capillary refill. Normal sensation noted. Normal strength noted.  Neurological: He is alert and oriented to person, place, and time.  Skin: Skin is warm and dry. No rash noted. He is not diaphoretic. No erythema. No pallor.  Psychiatric: He has a normal mood and affect. His behavior is normal. Judgment and thought content normal.   [facility] reading (PRIMARY) by  Dr. Brigitte Pulse. No acute bony abnormalities.    Procedure Consent obtained. 4.5 cc 1% lido digital block. Cleaned wound with soap and water. Pinrose drain placed. Wound explored. No tendon involvement. Bone not appreciated. 5-0 ethilon #5 simple interrupted sutures and #1 horizontal mattress sutures  placed. Clean dressing placed. Care instructions discussed.   Assessment:   Plan:     1. Pain of finger of left hand 2. Finger injury, left, initial encounter Warning signs discussed, otherwise, RTC 10/30/15 for suture removal. TDAP UTD.  - DG Finger Index Left; Future - HYDROcodone-acetaminophen (NORCO) 5-325 MG tablet; Take 1 tablet by mouth every 6 (six) hours as needed for moderate pain.  Dispense: 30 tablet; Refill: 0   Kila Godina PA-C  [facility] 10/23/2015 3:26 PM

## 2015-10-24 ENCOUNTER — Telehealth: Payer: Self-pay | Admitting: Family Medicine

## 2015-10-24 NOTE — Telephone Encounter (Signed)
Answering service call - 6:52am. Initial call back - left voice mail, answered on repeat call at 7:10am  Up all night due to pain in finger from injury yesterday.  Requesting stronger medicine - initially spoke to Alfonse AlpersGrace Niday, then [place] took call.  Hydrocodone not helping for finger pain. Has been taking 2 each time, taking every 5 hours, and not getting any relief. Pain in same area of injury. Advised that I can not call in anything stronger over the phone.  Based on amount of pain even with taking 2 at a time 5 hours apart, should RTC this morning for eval to make sure not issue requiring hand eval.  Can then determine change to other med as will need to be printed. Understanding expressed.

## 2015-11-01 ENCOUNTER — Ambulatory Visit (INDEPENDENT_AMBULATORY_CARE_PROVIDER_SITE_OTHER): Payer: BLUE CROSS/BLUE SHIELD | Admitting: Physician Assistant

## 2015-11-01 ENCOUNTER — Ambulatory Visit: Payer: Worker's Compensation

## 2015-11-01 VITALS — BP 128/80 | HR 70 | Temp 98.0°F | Resp 16

## 2015-11-01 DIAGNOSIS — S6992XD Unspecified injury of left wrist, hand and finger(s), subsequent encounter: Secondary | ICD-10-CM | POA: Diagnosis not present

## 2015-11-01 DIAGNOSIS — M79645 Pain in left finger(s): Secondary | ICD-10-CM | POA: Diagnosis not present

## 2015-11-01 DIAGNOSIS — M25442 Effusion, left hand: Secondary | ICD-10-CM

## 2015-11-01 MED ORDER — AMOXICILLIN-POT CLAVULANATE 875-125 MG PO TABS: 1.0000 | ORAL_TABLET | Freq: Two times a day (BID) | ORAL | Status: AC

## 2015-11-01 NOTE — Progress Notes (Addendum)
[facility] [address], [place] [address] [phone]  Date:  11/01/2015   Name:  Juan DubinRandy Bousquet   DOB:  02/18/1971   MRN:  [id number]  PCP:  Jeoffrey MassedMCGOWEN,PHILIP H, MD    Chief Complaint: Suture / Staple Removal   History of Present Illness:  This is a 44 y.o. male who is presenting for suture removal from a laceration on left index finger. Laceration occurred 11/2 and was repaired with #6 sutures. Radiograph negative for fracture. He is reporting today that he is still having finger pain and swelling. Laceration is located to lateral finger. Most of his pain is at tip of finger. He denies fever, chills, drainage, paresthesias.  Review of Systems:  Review of Systems See HPI  Meds reviewed.  No Known Allergies  Medication list has been reviewed and updated.  Physical Examination:  Physical Exam  Constitutional: He is oriented to person, place, and time. He appears well-developed and well-nourished. No distress.  HENT:  Head: Normocephalic and atraumatic.  Right Ear: Hearing normal.  Left Ear: Hearing normal.  Nose: Nose normal.  Eyes: Conjunctivae and lids are normal. Right eye exhibits no discharge. Left eye exhibits no discharge. No scleral icterus.  Pulmonary/Chest: Effort normal. No respiratory distress.  Musculoskeletal:       Right hand: Normal.       Left hand: He exhibits decreased range of motion (decreased flexion d/t swelling), tenderness (distal tip of left index finger), laceration (laceration at lateral index finger with #6 sutures intact. No drainage. No significant tenderness over laceration.) and swelling (distal index finger. Swelling of pulp. No induration or fluctuance. Erythema of distal finger). He exhibits normal capillary refill. Normal sensation noted.  Neurological: He is alert and oriented to person, place, and time.  Skin: Skin is warm and dry.  Sutures removed  Psychiatric: He has a normal mood and affect. His speech is normal  and behavior is normal. Thought content normal.   BP 128/80 mmHg  Pulse 70  Temp(Src) 98 F (36.7 C) (Oral)  Resp 16  SpO2 98%  Radiograph IMPRESSION: Persistent distal left index finger soft tissue swelling without acute osseous abnormality.  Assessment and Plan:  1. Finger pain, left 2. Finger swelling, left 3. Finger injury, left, subsequent encounter Sutures removed, laceration healing well. No drainage. He does have persistent pain and swelling/erythema of distal finger. Radiograph again without fracture. Likely just a contusion. Will splint 1 week. Augmentin twice a day for possible infection. Return in 7-10 days if symptoms not improving or at any time if symptoms worsen. - DG Finger Index Left; Future - amoxicillin-clavulanate (AUGMENTIN) 875-125 MG tablet; Take 1 tablet by mouth 2 (two) times daily.  Dispense: 20 tablet; Refill: 0   Roswell MinersNicole V. Dyke BrackettBush, PA-C, MHS [facility]  11/01/2015

## 2015-11-01 NOTE — Patient Instructions (Signed)
Take antibiotic twice a day for 10 days. Wear splint for 1 week. Return after 7-10 days if symptoms not improved.

## 2015-11-17 ENCOUNTER — Other Ambulatory Visit: Payer: Self-pay | Admitting: Physician Assistant

## 2015-12-11 ENCOUNTER — Ambulatory Visit (INDEPENDENT_AMBULATORY_CARE_PROVIDER_SITE_OTHER): Payer: BLUE CROSS/BLUE SHIELD | Admitting: Family Medicine

## 2015-12-11 VITALS — BP 110/70 | HR 78 | Temp 98.5°F | Resp 18 | Ht 68.0 in | Wt 180.0 lb

## 2015-12-11 DIAGNOSIS — J0100 Acute maxillary sinusitis, unspecified: Secondary | ICD-10-CM | POA: Diagnosis not present

## 2015-12-11 DIAGNOSIS — J029 Acute pharyngitis, unspecified: Secondary | ICD-10-CM | POA: Diagnosis not present

## 2015-12-11 DIAGNOSIS — R21 Rash and other nonspecific skin eruption: Secondary | ICD-10-CM

## 2015-12-11 DIAGNOSIS — M79645 Pain in left finger(s): Secondary | ICD-10-CM | POA: Diagnosis not present

## 2015-12-11 MED ORDER — PREDNISONE 20 MG PO TABS: ORAL_TABLET | ORAL | Status: AC

## 2015-12-11 MED ORDER — AMOXICILLIN-POT CLAVULANATE 875-125 MG PO TABS: 1.0000 | ORAL_TABLET | Freq: Two times a day (BID) | ORAL | Status: DC

## 2015-12-11 MED ORDER — IVERMECTIN 3 MG PO TABS: 12.0000 mg | ORAL_TABLET | Freq: Once | ORAL | Status: DC

## 2015-12-11 NOTE — Patient Instructions (Signed)
Please return appear not significantly better in each of these areas by Saturday

## 2015-12-11 NOTE — Progress Notes (Signed)
 @UMFCLOGO @  By signing my name below, I, Juan Meyer, attest that this documentation has been prepared under the direction and in the presence of Juan SidleKurt Mira Balon, MD.  Electronically Signed: Andrew Auaven Meyer, ED Scribe. 12/11/2015. 5:07 PM.  Patient ID: Juan Meyer MRN: [id number], DOB: 04/11/1971, 44 y.o. Date of Encounter: 12/11/2015, 5:01 PM  Primary Physician: Jeoffrey MassedMCGOWEN,PHILIP H, MD  Chief Complaint:  Chief Complaint  Patient presents with  . Rash    all over x4 weeks   . Sinus Problem    x3 days   . Hand Pain    left index finger injury follow up     HPI: 44 y.o. year old male with history below presents with left index finger injury that occurred 7 weeks ago. Pt hit left index finger while using a hammer. States lacerations has healed well but has had swelling and difficulty bending left index. He has not tried at home remedies to treat symptoms. Pt is unable to take NSAID's  Pt also complain of erythematous, itchy rash, to bilateral hands and torso, for about 4 weeks. Rash is not on hands, legs, neck, groin or face.   Pt also has sore throat and sinus congestion for 3 days. Pt denies drug allergies.   Past Medical History  Diagnosis Date  . Ulcer 06/2010    NSAID-induced.  Duodenal bulb.  +GI bleed.  . Esophageal stricture 2011  . Duodenal stricture 2011  . Chronic neck pain     s/p falling off scaffold 1997.  This is better now.  [name]. GERD (gastroesophageal reflux disease)   . Hyperlipidemia   . Migraine headache   . Bronchitis 11/15/2012     Home Meds: Prior to Admission medications   Medication Sig Start Date End Date Taking? Authorizing Provider  acetaminophen (TYLENOL) 500 MG tablet Take 1,000 mg by mouth every 6 (six) hours as needed. For pain   Yes Historical Provider, MD  Beclomethasone Dipropionate 80 MCG/ACT AERS Place 2 puffs into the nose daily. 04/18/15  Yes Collie SiadStephanie D English, PA  buPROPion (WELLBUTRIN SR) 100 MG 12 hr tablet Take 100 mg by mouth 2 (two) times  daily.   Yes Historical Provider, MD  clonazePAM (KLONOPIN) 1 MG tablet Take 1-2 tablets (1-2 mg total) by mouth at bedtime as needed. 04/18/15  Yes Collie SiadStephanie D English, PA  omeprazole (PRILOSEC) 20 MG capsule Take 20 mg by mouth 2 (two) times daily.    Yes Historical Provider, MD  cetirizine (ZYRTEC) 10 MG tablet Take 1 tablet (10 mg total) by mouth daily. Patient not taking: Reported on 10/23/2015 04/18/15   Collie SiadStephanie D English, PA  citalopram (CELEXA) 40 MG tablet Take 0.5 tablets (20 mg total) by mouth daily. Patient not taking: Reported on 04/18/2015 11/22/13   Jeoffrey MassedPhilip H McGowen, MD  FLUoxetine (PROZAC) 20 MG tablet Take 1 tablet (20 mg total) by mouth daily. Patient not taking: Reported on 10/23/2015 04/18/15   Collie SiadStephanie D English, PA  HYDROcodone-acetaminophen (NORCO) 5-325 MG tablet Take 1 tablet by mouth every 6 (six) hours as needed for moderate pain. Patient not taking: Reported on 12/11/2015 10/23/15   [name], PA-C  oxyCODONE-acetaminophen (PERCOCET/ROXICET) 5-325 MG per tablet 1-2 tabs po q6h prn pain Patient not taking: Reported on 04/18/2015 07/27/13   Jeoffrey MassedPhilip H McGowen, MD  promethazine (PHENERGAN) 12.5 MG tablet Take 1 tablet (12.5 mg total) by mouth every 6 (six) hours as needed for nausea or vomiting. Patient not taking: Reported on 12/11/2015 10/23/15   [name],  PA-C  promethazine (PHENERGAN) 25 MG tablet Take 1 tablet (25 mg total) by mouth every 8 (eight) hours as needed for nausea. Patient not taking: Reported on 10/23/2015 03/23/13   [name], MD  rizatriptan (MAXALT) 10 MG tablet Take 1 tablet (10 mg total) by mouth as needed for migraine. May repeat in 2 hours if needed Patient not taking: Reported on 10/23/2015 05/30/13   [name], MD    Allergies: No Known Allergies  Social History   Social History  . Marital Status: Divorced    Spouse Name: [name]  . Number of Children: N/A  . Years of Education: N/A   Occupational History  .  Maintenance Plant Mgr    Social History Main Topics  . Smoking status: Never Smoker   . Smokeless tobacco: Never Used  . Alcohol Use: No  . Drug Use: No  . Sexual Activity: Not on file   Other Topics Concern  . Not on file   Social History Narrative   Married, 3 children (ages 15y 5y, 16, y).   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     No T/A/Ds.  Lots of dust exposure at work but no other potential lung irritants/carcinogens.   Exercised 5d/week up until 6 mo ago.  Now does this less.     Review of Systems: Constitutional: negative for chills, fever, night sweats, weight changes, or fatigue  HEENT: negative for vision changes, hearing loss, congestion, rhinorrhea, ST, epistaxis. Positive sore throat and sinus pressure. Cardiovascular: negative for chest pain or palpitations Respiratory: negative for hemoptysis, wheezing, shortness of breath, or cough Abdominal: negative for abdominal pain, nausea, vomiting, diarrhea, or constipation Dermatological: Positive rash. Neurologic: negative for headache, dizziness, or syncope All other systems reviewed and are otherwise negative with the exception to those above and in the HPI.   Physical Exam: Blood pressure 110/70, pulse 78, temperature 98.5 F (36.9 C), temperature source Oral, resp. rate 18, height  (1.727 m), weight 180 lb (81.647 kg), SpO2 98 %., Body mass index is 27.38 kg/(m^2). General: Well developed, well nourished, in no acute distress. Head: Normocephalic, atraumatic, eyes without discharge, sclera non-icteric, nares are without discharge. Bilateral auditory canals clear, TM's are without perforation, pearly grey and translucent with reflective cone of light bilaterally. Oral cavity moist, posterior pharynx without exudate, erythema, peritonsillar abscess, or post nasal drip.    throat reddened, nasal passages swollen with yellow purulent discharge  Neck: Supple. No thyromegaly. Full ROM. No  lymphadenopathy. Abdomen: Soft, non-tender, non-distended with normoactive bowel sounds. No hepatomegaly. No rebound/guarding. No obvious abdominal masses. Msk:  Strength and tone normal for age. Extremities/Skin: Warm and dry. No clubbing or cyanosis. No edema. No rashes or suspicious lesions.   Left index finger shows a well-healed laceration scar and mild erythema with no tenderness but with decreased flexion of the DIP.  Patient has multiple 1 mm scabbed over rash on both forearms Neuro: Alert and oriented X 3. Moves all extremities spontaneously. Gait is normal. CNII-XII grossly in tact. Psych:  Responds to questions appropriately with a normal affect.    ASSESSMENT AND PLAN:  44 y.o. year old male with  This chart was scribed in my presence and reviewed by me personally.    ICD-9-CM ICD-10-CM   1. Finger pain, left 729.5 M79.645 predniSONE (DELTASONE) 20 MG tablet  2. Acute maxillary sinusitis, recurrence not specified 461.0 J01.00 amoxicillin-clavulanate (AUGMENTIN) 875-125 MG tablet  3. Rash and nonspecific skin eruption 782.1 R21 ivermectin (STROMECTOL) 3  MG TABS tablet  4. Sore throat 462 J02.9 amoxicillin-clavulanate (AUGMENTIN) 875-125 MG tablet      Signed, Juan Sidle, MD 12/11/2015 5:01 PM

## 2015-12-17 ENCOUNTER — Ambulatory Visit (INDEPENDENT_AMBULATORY_CARE_PROVIDER_SITE_OTHER): Payer: BLUE CROSS/BLUE SHIELD | Admitting: Emergency Medicine

## 2015-12-17 VITALS — BP 140/90 | HR 71 | Temp 97.9°F | Resp 17 | Ht 68.0 in | Wt 179.0 lb

## 2015-12-17 DIAGNOSIS — M20012 Mallet finger of left finger(s): Secondary | ICD-10-CM | POA: Diagnosis not present

## 2015-12-17 DIAGNOSIS — J0100 Acute maxillary sinusitis, unspecified: Secondary | ICD-10-CM | POA: Diagnosis not present

## 2015-12-17 MED ORDER — LEVOFLOXACIN 500 MG PO TABS: 500.0000 mg | ORAL_TABLET | Freq: Every day | ORAL | Status: AC

## 2015-12-17 MED ORDER — PSEUDOEPHEDRINE-GUAIFENESIN ER 60-600 MG PO TB12: 1.0000 | ORAL_TABLET | Freq: Two times a day (BID) | ORAL | Status: AC

## 2015-12-17 NOTE — Progress Notes (Signed)
Subjective:  Patient ID: Juan Meyer, male    DOB: 07/22/1971  Age: 44 y.o. MRN: [id number]  CC: Sinus Congestion and Follow-up   HPI Juan DubinRandy Krupka presents   He has nasal congestion and postnasal drainage cough. Been unable to control her symptoms with sleep prescribed Augmentin. He has purulent nasal drainage or purulent sputum. He has no fever or chills. No wheezing or shortness of breath. He has no nausea vomiting or stool change.. No rash.   He was seen previously for a laceration of finger and that was repaired now is unable to flex his finger at the DIP joint.  History Juan Meyer has a past medical history of Ulcer (06/2010); Esophageal stricture (2011); Duodenal stricture (2011); Chronic neck pain; GERD (gastroesophageal reflux disease); Hyperlipidemia; Migraine headache; and Bronchitis (11/15/2012).   He has past surgical history that includes Esophagogastroduodenoscopy.   His  family history is not on file.  He   reports that he has never smoked. He has never used smokeless tobacco. He reports that he does not drink alcohol or use illicit drugs.  Outpatient Prescriptions Prior to Visit  Medication Sig Dispense Refill  . acetaminophen (TYLENOL) 500 MG tablet Take 1,000 mg by mouth every 6 (six) hours as needed. For pain    . amoxicillin-clavulanate (AUGMENTIN) 875-125 MG tablet Take 1 tablet by mouth 2 (two) times daily. 20 tablet 0  . Beclomethasone Dipropionate 80 MCG/ACT AERS Place 2 puffs into the nose daily. 8.7 Inhaler 4  . buPROPion (WELLBUTRIN SR) 100 MG 12 hr tablet Take 100 mg by mouth 2 (two) times daily.    . clonazePAM (KLONOPIN) 1 MG tablet Take 1-2 tablets (1-2 mg total) by mouth at bedtime as needed. 60 tablet 0  . cetirizine (ZYRTEC) 10 MG tablet Take 1 tablet (10 mg total) by mouth daily. (Patient not taking: Reported on 10/23/2015) 30 tablet 11  . citalopram (CELEXA) 40 MG tablet Take 0.5 tablets (20 mg total) by mouth daily. (Patient not taking: Reported on  04/18/2015) 15 tablet 3  . FLUoxetine (PROZAC) 20 MG tablet Take 1 tablet (20 mg total) by mouth daily. (Patient not taking: Reported on 10/23/2015) 30 tablet 3  . HYDROcodone-acetaminophen (NORCO) 5-325 MG tablet Take 1 tablet by mouth every 6 (six) hours as needed for moderate pain. (Patient not taking: Reported on 12/11/2015) 30 tablet 0  . ivermectin (STROMECTOL) 3 MG TABS tablet Take 4 tablets (12 mg total) by mouth once. (Patient not taking: Reported on 12/17/2015) 4 tablet 0  . omeprazole (PRILOSEC) 20 MG capsule Take 20 mg by mouth 2 (two) times daily. Reported on 12/17/2015    . oxyCODONE-acetaminophen (PERCOCET/ROXICET) 5-325 MG per tablet 1-2 tabs po q6h prn pain (Patient not taking: Reported on 04/18/2015) 30 tablet 0  . predniSONE (DELTASONE) 20 MG tablet Two daily with food (Patient not taking: Reported on 12/17/2015) 10 tablet 0  . promethazine (PHENERGAN) 12.5 MG tablet Take 1 tablet (12.5 mg total) by mouth every 6 (six) hours as needed for nausea or vomiting. (Patient not taking: Reported on 12/11/2015) 30 tablet 0  . promethazine (PHENERGAN) 25 MG tablet Take 1 tablet (25 mg total) by mouth every 8 (eight) hours as needed for nausea. (Patient not taking: Reported on 10/23/2015) 30 tablet 1  . rizatriptan (MAXALT) 10 MG tablet Take 1 tablet (10 mg total) by mouth as needed for migraine. May repeat in 2 hours if needed (Patient not taking: Reported on 10/23/2015) 9 tablet 3   No facility-administered medications prior  to visit.    Social History   Social History  . Marital Status: Divorced    Spouse Name: [name]  . Number of Children: N/A  . Years of Education: N/A   Occupational History  . Maintenance Plant Mgr    Social History Main Topics  . Smoking status: Never Smoker   . Smokeless tobacco: Never Used  . Alcohol Use: No  . Drug Use: No  . Sexual Activity: Not Asked   Other Topics Concern  . None   Social History Narrative   Married, 3 children (ages 73y 5y, 62, y).     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     No T/A/Ds.  Lots of dust exposure at work but no other potential lung irritants/carcinogens.   Exercised 5d/week up until 6 mo ago.  Now does this less.     Review of Systems  Constitutional: Negative for fever, chills and appetite change.  HENT: Positive for congestion, postnasal drip and rhinorrhea. Negative for ear pain, sinus pressure and sore throat.   Eyes: Negative for pain and redness.  Respiratory: Negative for cough, shortness of breath and wheezing.   Cardiovascular: Negative for leg swelling.  Gastrointestinal: Negative for nausea, vomiting, abdominal pain, diarrhea, constipation and blood in stool.  Endocrine: Negative for polyuria.  Genitourinary: Negative for dysuria, urgency, frequency and flank pain.  Musculoskeletal: Negative for gait problem.  Skin: Negative for rash.  Neurological: Negative for weakness and headaches.  Psychiatric/Behavioral: Negative for confusion and decreased concentration. The patient is not nervous/anxious.     Objective:  BP 140/90 mmHg  Pulse 71  Temp(Src) 97.9 F (36.6 C) (Oral)  Resp 17  Ht  (1.727 m)  Wt 179 lb (81.194 kg)  BMI 27.22 kg/m2  SpO2 96%  Physical Exam  Constitutional: He is oriented to person, place, and time. He appears well-developed and well-nourished. No distress.  HENT:  Head: Normocephalic and atraumatic.  Right Ear: External ear normal.  Left Ear: External ear normal.  Nose: Nose normal.  Eyes: Conjunctivae and EOM are normal. Pupils are equal, round, and reactive to light. No scleral icterus.  Neck: Normal range of motion. Neck supple. No tracheal deviation present.  Cardiovascular: Normal rate, regular rhythm and normal heart sounds.   Pulmonary/Chest: Effort normal. No respiratory distress. He has no wheezes. He has no rales.  Abdominal: He exhibits no mass. There is no tenderness. There is no rebound and no guarding.  Musculoskeletal: He  exhibits no edema.       Left hand: He exhibits decreased range of motion.   He is unable to flex at the the DIP joint of his finger. He has no swelling or ecchymosis.  Lymphadenopathy:    He has no cervical adenopathy.  Neurological: He is alert and oriented to person, place, and time. Coordination normal.  Skin: Skin is warm and dry. No rash noted.  Psychiatric: He has a normal mood and affect. His behavior is normal.      Assessment & Plan:   Loukas was seen today for sinus congestion and follow-up.  Diagnoses and all orders for this visit:  Mallet finger, acquired, left -     Ambulatory referral to Hand Surgery  Acute maxillary sinusitis, recurrence not specified  Other orders -     pseudoephedrine-guaifenesin (MUCINEX D) 60-600 MG 12 hr tablet; Take 1 tablet by mouth every 12 (twelve) hours. -     levofloxacin (LEVAQUIN) 500 MG tablet; Take 1 tablet (500 mg total) by  mouth daily.   I am having Mr. Belling start on pseudoephedrine-guaifenesin and levofloxacin. I am also having him maintain his omeprazole, acetaminophen, promethazine, rizatriptan, oxyCODONE-acetaminophen, citalopram, cetirizine, clonazePAM, FLUoxetine, Beclomethasone Dipropionate, buPROPion, HYDROcodone-acetaminophen, promethazine, predniSONE, amoxicillin-clavulanate, and ivermectin.  Meds ordered this encounter  Medications  . pseudoephedrine-guaifenesin (MUCINEX D) 60-600 MG 12 hr tablet    Sig: Take 1 tablet by mouth every 12 (twelve) hours.    Dispense:  18 tablet    Refill:  0  . levofloxacin (LEVAQUIN) 500 MG tablet    Sig: Take 1 tablet (500 mg total) by mouth daily.    Dispense:  10 tablet    Refill:  0    Appropriate red flag conditions were discussed with the patient as well as actions that should be taken.  Patient expressed his understanding.  Follow-up: Return if symptoms worsen or fail to improve.  Carmelina Dane, MD

## 2015-12-17 NOTE — Patient Instructions (Signed)
Mallet Finger  Mallet finger is an injury that occurs from a blow to the tip of your straightened finger or thumb. It is also known as baseball finger. The blow to your fingertip causes it to bend farther than normal, which tears the cord that attaches to the tip of your finger (extensor tendon). Your extensor tendon is what straightens the end of your finger. If this tendon is damaged, you will not be able to straighten your fingertip. Sometimes, a piece of bone may be pulled away with the tendon (avulsion injury), or the tendon may tear completely. In some cases, surgery may be required to repair the damage.  CAUSES  Mallet finger is caused by a hard, direct hit to the tip of your finger or thumb. This injury often happens from getting hit in the finger with a hard ball, such as a baseball.  RISK FACTORS  This injury is more likely to happen if you play sports that use a hard ball.  SYMPTOMS   The main symptom of this injury is not being able to straighten the tip of your finger. You can manually straighten your fingertip with your other hand, but the finger cannot straighten on its own. Other symptoms may include:  · Pain.  · Swelling.  · Bruising.  · Blood under the fingernail.  DIAGNOSIS   Your health care provider may suspect mallet finger if you are not able to extend your fingertip, especially if you recently injured your hand. Your health care provider will do a physical exam. This may include X-rays to see if a piece of bone has been pulled away or if the finger joint has separated (dislocated).  TREATMENT   Mallet finger may be treated with:  · Wearing a splint on your fingertip to keep it straight (extended) while the tendon heals.  · Surgery to repair the tendon, in severe cases. This may involve:    The use of a pin or screw to keep your finger extended and your tendon attached.    Taking a piece of tendon from another part of your body (graft) to replace a torn tendon.  HOME CARE INSTRUCTIONS   · Take  medicines only as directed by your health care provider.  · Wear the splint as directed by your health care provider. Remove it only as directed by your health care provider.  · If you take your splint off to dry it or change it, gently press your finger on a flat surface to keep it straight.  · If directed, apply ice to the injured area:      Put ice in a plastic bag.      Place a towel between your skin and the bag.      Leave the ice on for 20 minutes, 2-3 times a day.  · Raise the injured area above the level of your heart while you are sitting or lying down.  SEEK MEDICAL CARE IF:   · You have pain or swelling that is getting worse.    · Your finger feels cold.    · You cannot extend your finger after treatment.  SEEK IMMEDIATE MEDICAL CARE IF:   · Even after loosening your splint, your finger is:    Very red and swollen.    White or blue.    Numb or tingling.     This information is not intended to replace advice given to you by your health care provider. Make sure you discuss any questions you have with your health   care provider.     Document Released: 12/04/2000 Document Revised: 04/23/2015 Document Reviewed: 10/10/2014  Elsevier Interactive Patient Education ©2016 [employer]vier Inc[employer]

## 2016-01-05 NOTE — Progress Notes (Signed)
Noted addendum/revision on 01/05/16. Kristi Paulita FujitaMartin Smith, M.D. [facility]  [place] [address] [place], [place]  [address] [phone] phone [phone] fax

## 2016-01-06 NOTE — Progress Notes (Signed)
Patient ID: Shelby DubinRandy Meyer, male   DOB: 03/07/1971, 45 y.o.   MRN: [id number] Agree with any changes made by Dr. Nilda SimmerKristi Smith. Norberto SorensonEva Jp Eastham, MD MPH 12:47 PM 01/06/2016

## 2016-01-12 ENCOUNTER — Ambulatory Visit (INDEPENDENT_AMBULATORY_CARE_PROVIDER_SITE_OTHER): Payer: 59 | Admitting: Physician Assistant

## 2016-01-12 VITALS — BP 126/80 | HR 79 | Temp 98.0°F | Resp 16 | Ht 67.0 in | Wt 175.0 lb

## 2016-01-12 DIAGNOSIS — R079 Chest pain, unspecified: Secondary | ICD-10-CM

## 2016-01-12 DIAGNOSIS — M20012 Mallet finger of left finger(s): Secondary | ICD-10-CM

## 2016-01-12 DIAGNOSIS — R5383 Other fatigue: Secondary | ICD-10-CM | POA: Diagnosis not present

## 2016-01-12 DIAGNOSIS — B889 Infestation, unspecified: Secondary | ICD-10-CM

## 2016-01-12 LAB — POCT CBC
Granulocyte percent: 59.4 % (ref 37–80)
HCT, POC: 52.9 % (ref 43.5–53.7)
Hemoglobin: 17.6 g/dL (ref 14.1–18.1)
Lymph, poc: 2 (ref 0.6–3.4)
MCH, POC: 29.5 pg (ref 27–31.2)
MCHC: 33.3 g/dL (ref 31.8–35.4)
MCV: 88.7 fL (ref 80–97)
MID (cbc): 0.6 (ref 0–0.9)
MPV: 7.5 fL (ref 0–99.8)
POC Granulocyte: 3.8 (ref 2–6.9)
POC LYMPH PERCENT: 31.7 % (ref 10–50)
POC MID %: 8.9 % (ref 0–12)
Platelet Count, POC: 236 K/uL (ref 142–424)
RBC: 5.96 M/uL (ref 4.69–6.13)
RDW, POC: 13.5 %
WBC: 6.4 K/uL (ref 4.6–10.2)

## 2016-01-12 LAB — TSH: TSH: 0.585 u[IU]/mL (ref 0.350–4.500)

## 2016-01-12 MED ORDER — IVERMECTIN 3 MG PO TABS: 12.0000 mg | ORAL_TABLET | Freq: Once | ORAL | Status: AC

## 2016-01-12 NOTE — Patient Instructions (Addendum)
Your EKG is not alarming.  However, I am going to go ahead and refer you to cardiology at this time.  We need to be able to exclude acute conditions that can only be seen with further evaluation.   At this time, I would like you to refrain from strenuous exercise and activity until we can have a complete evaluation. I would like you to also follow up with your Wellness doctor.  You are beyond therapeutic at this time, and this needs to be better managed to avoid harmful side effects.  Please await contact for this appointment.   Also, await contact from the hand surgeon for this finger injury.   We will do a second dose for the mites break out as well.

## 2016-01-12 NOTE — Progress Notes (Signed)
[facility] [address] [place] [address] [phone]  Date:  01/12/2016   Name:  Juan Meyer   DOB:  Feb 17, 1971   MRN:  [id number]  PCP:  [name], MD   Chief Complaint  Patient presents with  . Fatigue    x 2 weeks  . Chest Pain    radiates to left arm  . Hand Pain    left pointer finger, x 2 months  . Insect Bite     History of Present Illness:  Juan Meyer is a 45 y.o. male patient who presents to [facility] for chief complaint of chest pain, rash, and finger pain.     Chest pain: sporadic at the left side of her chest.  palpable chest pain that feels sore.  Not associated with exertion.  May be associated with palpitations, though he has hx of anxiety attacks.  Not associated with leg swelling, sob, or dizziness.  Cough--he was diagnosed with lower respiratory infection.  He was placed on Levaquin.  He was coughing up brown sputum.  He uses a nasal rinse daily.  Fatigue for 2 weeks 25 and 45 year old.  No night sweats.  Phone wellness doctor  1400 testosterone, dr. Blima Singer.   Testosterone Sunday, pill with anastrol Monday and Thursday, hcg on Friday and Saturday.  Thursday 1/2 of the dose.  He has not taken it Sunday.     Hemoglobin is high--12/22/2015.  18.4 hemoglobin 8 months to a year of hemoglobin.  He has used higher amount s testosterone Finger pain sore over the last two knuckles, with increased redness and swelling.   No hx of thyroid difficulties.   Non-smoker, no etOH use. No blood in the stool.   18.2 H 128 L 75 glucose 28.10 Ferritin 4.980 free t4 .FSH  .148 progesteroneestrone 79 1500 testosterone Finger pain continues--was told this was mallet finger, and a referral was placed.  There was a miscommunication of the referral, and he has yet to get contact for this appointment.  Pain continues, and will at times become locked.    Rash is still present along arms.  This was dx as mites.  He states the ivermectin worked, but the  rash is minimally present.   Patient Active Problem List   Diagnosis Date Noted  . Plantar fasciitis of left foot 05/30/2013  . Head and face pain 03/26/2013  . Bronchitis 11/15/2012  . Dyspepsia 09/19/2012  . External hemorrhoid, thrombosed 09/19/2012  . Migraine headache without aura 09/19/2012  . Health maintenance examination 07/27/2012  . Onychomycosis 07/27/2012  . Insomnia 03/01/2012  . GAD (generalized anxiety disorder) 12/08/2011  . Hyperlipidemia 11/24/2011  . Fatigue 11/24/2011  . ANEMIA, SECONDARY TO ACUTE BLOOD LOSS 01/20/2011  . DUODENAL ULCER, ACUTE, HEMORRHAGE 01/20/2011    Past Medical History  Diagnosis Date  . Ulcer 06/2010    NSAID-induced.  Duodenal bulb.  +GI bleed.  . Esophageal stricture 2011  . Duodenal stricture 2011  . Chronic neck pain     s/p falling off scaffold 1997.  This is better now.  [name] GERD (gastroesophageal reflux disease)   . Hyperlipidemia   . Migraine headache   . Bronchitis 11/15/2012    Past Surgical History  Procedure Laterality Date  . Esophagogastroduodenoscopy      Social History  Substance Use Topics  . Smoking status: Never Smoker   . Smokeless tobacco: Never Used  . Alcohol Use: No    History reviewed. No  pertinent family history.  No Known Allergies  Medication list has been reviewed and updated.  Current Outpatient Prescriptions on File Prior to Visit  Medication Sig Dispense Refill  . acetaminophen (TYLENOL) 500 MG tablet Take 1,000 mg by mouth every 6 (six) hours as needed. For pain    . Beclomethasone Dipropionate 80 MCG/ACT AERS Place 2 puffs into the nose daily. 8.7 Inhaler 4  . buPROPion (WELLBUTRIN SR) 100 MG 12 hr tablet Take 100 mg by mouth 2 (two) times daily.    . cetirizine (ZYRTEC) 10 MG tablet Take 1 tablet (10 mg total) by mouth daily. 30 tablet 11  . clonazePAM (KLONOPIN) 1 MG tablet Take 1-2 tablets (1-2 mg total) by mouth at bedtime as needed. 60 tablet 0  . ivermectin (STROMECTOL) 3 MG TABS  tablet Take 4 tablets (12 mg total) by mouth once. 4 tablet 0  . omeprazole (PRILOSEC) 20 MG capsule Take 20 mg by mouth 2 (two) times daily. Reported on 12/17/2015    . promethazine (PHENERGAN) 12.5 MG tablet Take 1 tablet (12.5 mg total) by mouth every 6 (six) hours as needed for nausea or vomiting. 30 tablet 0  . promethazine (PHENERGAN) 25 MG tablet Take 1 tablet (25 mg total) by mouth every 8 (eight) hours as needed for nausea. 30 tablet 1  . rizatriptan (MAXALT) 10 MG tablet Take 1 tablet (10 mg total) by mouth as needed for migraine. May repeat in 2 hours if needed 9 tablet 3  . citalopram (CELEXA) 40 MG tablet Take 0.5 tablets (20 mg total) by mouth daily. (Patient not taking: Reported on 04/18/2015) 15 tablet 3  . FLUoxetine (PROZAC) 20 MG tablet Take 1 tablet (20 mg total) by mouth daily. (Patient not taking: Reported on 10/23/2015) 30 tablet 3  . HYDROcodone-acetaminophen (NORCO) 5-325 MG tablet Take 1 tablet by mouth every 6 (six) hours as needed for moderate pain. (Patient not taking: Reported on 12/11/2015) 30 tablet 0  . oxyCODONE-acetaminophen (PERCOCET/ROXICET) 5-325 MG per tablet 1-2 tabs po q6h prn pain (Patient not taking: Reported on 04/18/2015) 30 tablet 0  . predniSONE (DELTASONE) 20 MG tablet Two daily with food (Patient not taking: Reported on 12/17/2015) 10 tablet 0  . pseudoephedrine-guaifenesin (MUCINEX D) 60-600 MG 12 hr tablet Take 1 tablet by mouth every 12 (twelve) hours. (Patient not taking: Reported on 01/12/2016) 18 tablet 0  . [DISCONTINUED] PARoxetine (PAXIL) 20 MG tablet Take 1 tablet (20 mg total) by mouth every morning. 30 tablet 1   No current facility-administered medications on file prior to visit.    ROS ROS otherwise unremarkable unless listed above.   Physical Examination: BP 126/80 mmHg  Pulse 79  Temp(Src) 98 F (36.7 C)  Resp 16  Ht  (1.702 m)  Wt 175 lb (79.379 kg)  BMI 27.40 kg/m2  SpO2 98% Ideal Body Weight: Weight in (lb) to have BMI  = 25: 159.3  Physical Exam  Constitutional: He is oriented to person, place, and time. He appears well-developed and well-nourished. No distress.  HENT:  Head: Normocephalic and atraumatic.  Eyes: Conjunctivae and EOM are normal. Pupils are equal, round, and reactive to light.  Cardiovascular: Normal rate and regular rhythm.  Exam reveals no friction rub.   No murmur heard. Pulmonary/Chest: Effort normal and breath sounds normal. No respiratory distress. He has no wheezes.  Neurological: He is alert and oriented to person, place, and time.  Skin: Skin is warm and dry. He is not diaphoretic.  Papules with minimal  scabbing along the extremities and neck.  No drainage, or swelling.  Erythema is minimal along the scabbing.    Psychiatric: He has a normal mood and affect. His behavior is normal.    Results for orders placed or performed in visit on 01/12/16  POCT CBC  Result Value Ref Range   WBC 6.4 4.6 - 10.2 K/uL   Lymph, poc 2.0 0.6 - 3.4   POC LYMPH PERCENT 31.7 10 - 50 %L   MID (cbc) 0.6 0 - 0.9   POC MID % 8.9 0 - 12 %M   POC Granulocyte 3.8 2 - 6.9   Granulocyte percent 59.4 37 - 80 %G   RBC 5.96 4.69 - 6.13 M/uL   Hemoglobin 17.6 14.1 - 18.1 g/dL   HCT, POC 81.1 91.4 - 53.7 %   MCV 88.7 80 - 97 fL   MCH, POC 29.5 27 - 31.2 pg   MCHC 33.3 31.8 - 35.4 g/dL   RDW, POC 78.2 %   Platelet Count, POC 236 142 - 424 K/uL   MPV 7.5 0 - 99.8 fL    Assessment and Plan: Phinneas Shakoor is a 45 y.o. male who is here today for chest pain, follow up of finger pain, and rash.   Chest pain, unspecified chest pain type - Plan: POCT CBC, TSH, EKG 12-Lead, Ambulatory referral to Cardiology --ekg unremarkable, however cardiology consult necessary.  He will have to return for a recheck testosterone. \ --I have advised that he follow up with his wellness doctor.  This needs to be re-evaluated, and testosterone needs to be better managed.   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    Trena Platt, PA-C [facility] 1/31/201710:20 PM

## 2016-01-13 ENCOUNTER — Other Ambulatory Visit: Payer: Self-pay | Admitting: Physician Assistant

## 2016-01-13 DIAGNOSIS — R7989 Other specified abnormal findings of blood chemistry: Secondary | ICD-10-CM

## 2016-01-21 ENCOUNTER — Encounter: Payer: Self-pay | Admitting: Physician Assistant

## 2016-02-05 ENCOUNTER — Encounter: Payer: Self-pay | Admitting: Physician Assistant

## 2016-02-05 DIAGNOSIS — I340 Nonrheumatic mitral (valve) insufficiency: Secondary | ICD-10-CM | POA: Insufficient documentation

## 2016-02-18 ENCOUNTER — Telehealth: Payer: Self-pay

## 2016-02-18 NOTE — Telephone Encounter (Signed)
PA completed for QNASL over the phone w/[facility]. Pending. Spoke to pt and he reported he has tried/failed Nasacort and Rhinocort as well as the NS in our system, Flonase and Nasonex. He states this is the only one that is effective for him.

## 2016-02-27 NOTE — Telephone Encounter (Signed)
I never heard back from [facility] so called to check status of PA. Was advised that the PA was denied because pt has to have also failed flunisolide AND zetonna (along with those he has already tried) before they will cover QNASL. Judeth CornfieldStephanie, do you want to Rx one of these for pt to try?

## 2016-02-28 NOTE — Telephone Encounter (Signed)
Yes, he will need this, so do the flunisolide 2 sprays in each nostril BID 1 refill.

## 2016-03-04 MED ORDER — FLUNISOLIDE 25 MCG/ACT (0.025%) NA SOLN: 2.0000 | Freq: Two times a day (BID) | NASAL | Status: AC

## 2016-03-04 NOTE — Telephone Encounter (Signed)
Sent in new Rx and asked pharm to advise pt when ready and that this replaces QNASL ins denied.

## 2016-04-26 ENCOUNTER — Other Ambulatory Visit: Payer: Self-pay | Admitting: Physician Assistant

## 2016-04-29 ENCOUNTER — Other Ambulatory Visit: Payer: Self-pay | Admitting: Physician Assistant

## 2016-11-24 ENCOUNTER — Emergency Department (HOSPITAL_BASED_OUTPATIENT_CLINIC_OR_DEPARTMENT_OTHER)
Admission: EM | Admit: 2016-11-24 | Discharge: 2016-11-24 | Disposition: A | Discharge status: Home / Self Care | Payer: 59 | Attending: Emergency Medicine | Admitting: Emergency Medicine

## 2016-11-24 ENCOUNTER — Encounter (HOSPITAL_BASED_OUTPATIENT_CLINIC_OR_DEPARTMENT_OTHER): Payer: Self-pay | Admitting: Emergency Medicine

## 2016-11-24 ENCOUNTER — Telehealth: Payer: Self-pay | Admitting: Gastroenterology

## 2016-11-24 DIAGNOSIS — Z7982 Long term (current) use of aspirin: Secondary | ICD-10-CM | POA: Diagnosis not present

## 2016-11-24 DIAGNOSIS — Z79899 Other long term (current) drug therapy: Secondary | ICD-10-CM | POA: Insufficient documentation

## 2016-11-24 DIAGNOSIS — R51 Headache: Secondary | ICD-10-CM | POA: Insufficient documentation

## 2016-11-24 DIAGNOSIS — G8929 Other chronic pain: Secondary | ICD-10-CM | POA: Diagnosis not present

## 2016-11-24 DIAGNOSIS — R42 Dizziness and giddiness: Secondary | ICD-10-CM | POA: Diagnosis present

## 2016-11-24 DIAGNOSIS — M542 Cervicalgia: Secondary | ICD-10-CM | POA: Diagnosis not present

## 2016-11-24 DIAGNOSIS — K922 Gastrointestinal hemorrhage, unspecified: Secondary | ICD-10-CM | POA: Insufficient documentation

## 2016-11-24 LAB — URINALYSIS, ROUTINE W REFLEX MICROSCOPIC
BILIRUBIN URINE: NEGATIVE
GLUCOSE, UA: NEGATIVE mg/dL
HGB URINE DIPSTICK: NEGATIVE
Ketones, ur: 15 mg/dL — AB
Leukocytes, UA: NEGATIVE
Nitrite: NEGATIVE
PH: 7 (ref 5.0–8.0)
Protein, ur: NEGATIVE mg/dL
SPECIFIC GRAVITY, URINE: 1.022 (ref 1.005–1.030)

## 2016-11-24 LAB — COMPREHENSIVE METABOLIC PANEL
ALBUMIN: 3.7 g/dL (ref 3.5–5.0)
ALK PHOS: 56 U/L (ref 38–126)
ALT: 36 U/L (ref 17–63)
ANION GAP: 5 (ref 5–15)
AST: 24 U/L (ref 15–41)
BUN: 43 mg/dL — ABNORMAL HIGH (ref 6–20)
CALCIUM: 8.7 mg/dL — AB (ref 8.9–10.3)
CO2: 25 mmol/L (ref 22–32)
Chloride: 107 mmol/L (ref 101–111)
Creatinine, Ser: 0.85 mg/dL (ref 0.61–1.24)
GFR calc Af Amer: >60 mL/min (ref >60)
GFR calc non Af Amer: >60 mL/min (ref >60)
GLUCOSE: 95 mg/dL (ref 65–99)
Potassium: 4.4 mmol/L (ref 3.5–5.1)
SODIUM: 137 mmol/L (ref 135–145)
Total Bilirubin: 0.7 mg/dL (ref 0.3–1.2)
Total Protein: 6.2 g/dL — ABNORMAL LOW (ref 6.5–8.1)

## 2016-11-24 LAB — CBC
HCT: 44.3 % (ref 39.0–52.0)
HEMOGLOBIN: 14.6 g/dL (ref 13.0–17.0)
MCH: 26.8 pg (ref 26.0–34.0)
MCHC: 33 g/dL (ref 30.0–36.0)
MCV: 81.3 fL (ref 78.0–100.0)
Platelets: 207 10*3/uL (ref 150–400)
RBC: 5.45 MIL/uL (ref 4.22–5.81)
RDW: 18.7 % — ABNORMAL HIGH (ref 11.5–15.5)
WBC: 8.5 10*3/uL (ref 4.0–10.5)

## 2016-11-24 LAB — PROTIME-INR
INR: 1.07
Prothrombin Time: 13.9 seconds (ref 11.4–15.2)

## 2016-11-24 LAB — OCCULT BLOOD X 1 CARD TO LAB, STOOL: Fecal Occult Bld: POSITIVE — AB

## 2016-11-24 LAB — LIPASE, BLOOD: Lipase: 17 U/L (ref 11–51)

## 2016-11-24 MED ORDER — ONDANSETRON HCL 4 MG/2ML IJ SOLN
4.0000 mg | Freq: Once | INTRAMUSCULAR | Status: AC
  Administered 2016-11-24: 4 mg via INTRAVENOUS
  Filled 2016-11-24: qty 2

## 2016-11-24 MED ORDER — OMEPRAZOLE 20 MG PO CPDR: 20.0000 mg | DELAYED_RELEASE_CAPSULE | Freq: Two times a day (BID) | ORAL | 1 refills | Status: AC

## 2016-11-24 MED ORDER — SUCRALFATE 1 G PO TABS
1.0000 g | ORAL_TABLET | Freq: Once | ORAL | Status: AC
  Administered 2016-11-24: 1 g via ORAL
  Filled 2016-11-24: qty 1

## 2016-11-24 MED ORDER — PANTOPRAZOLE SODIUM 40 MG IV SOLR
40.0000 mg | Freq: Once | INTRAVENOUS | Status: AC
  Administered 2016-11-24: 40 mg via INTRAVENOUS
  Filled 2016-11-24: qty 40

## 2016-11-24 MED ORDER — SODIUM CHLORIDE 0.9 % IV BOLUS (SEPSIS)
1000.0000 mL | Freq: Once | INTRAVENOUS | Status: AC
  Administered 2016-11-24: 1000 mL via INTRAVENOUS

## 2016-11-24 MED ORDER — FERROUS SULFATE 325 (65 FE) MG PO TABS: 325.0000 mg | ORAL_TABLET | Freq: Every day | ORAL | 0 refills | Status: AC

## 2016-11-24 MED ORDER — FAMOTIDINE 20 MG PO TABS
40.0000 mg | ORAL_TABLET | Freq: Once | ORAL | Status: AC
  Administered 2016-11-24: 40 mg via ORAL
  Filled 2016-11-24: qty 2

## 2016-11-24 MED ORDER — SODIUM CHLORIDE 0.9 % IV SOLN
INTRAVENOUS | Status: DC
  Administered 2016-11-24: 10:00:00 via INTRAVENOUS

## 2016-11-24 MED ORDER — SUCRALFATE 1 GM/10ML PO SUSP: 1.0000 g | Freq: Three times a day (TID) | ORAL | 1 refills | Status: AC

## 2016-11-24 MED FILL — FERROUS SULFATE 325 MG TAB: 325 (65 FE) | 100 days supply | Qty: 100 | Fill #0

## 2016-11-24 MED FILL — SUCRALFATE 1 GM TABLET: 1 | 11 days supply | Qty: 44 | Fill #0

## 2016-11-24 MED FILL — OMEPRAZOLE DR 20 MG CAPSULE: 20 | 30 days supply | Qty: 60 | Fill #0

## 2016-11-24 NOTE — ED Notes (Signed)
Patient is resting comfortably. 

## 2016-11-24 NOTE — ED Notes (Signed)
Ambulating to bathroom

## 2016-11-24 NOTE — ED Notes (Signed)
ED Provider at bedside. 

## 2016-11-24 NOTE — ED Notes (Signed)
Pt verbalized understanding of discharge instructions and denies any further questions at this time.   

## 2016-11-24 NOTE — ED Triage Notes (Signed)
Pt reports hx of duodenal ulcer states this feels similar.

## 2016-11-24 NOTE — Telephone Encounter (Signed)
appointment scheduled

## 2016-11-24 NOTE — Telephone Encounter (Signed)
Can see APP next available opening

## 2016-11-24 NOTE — ED Triage Notes (Signed)
45 yo c/o dark stools since las night (last iron pill wed). Dizziness began this morning. Denies diarrhea/vomiting or fever.

## 2016-11-24 NOTE — ED Provider Notes (Signed)
[facility] Provider Note   CSN: [id number] Arrival date & time: 11/24/16  4098     History   Chief Complaint Chief Complaint  Patient presents with  . dark stools  . Dizziness    HPI Juan Meyer is a 45 y.o. male.  HPI Patient reports his stool has been dark for the past 2 days. He has been starting to feel intermittently lightheaded with position change. Patient denies any vomiting. He reports stool has been formed. He has slight nausea but no abdominal pain. No syncopal episodes. No chest pain or shortness of breath. Patient reports he takes a number of vitamin supplements and intermittently takes iron. He used to take omeprazole regularly but didn't think he needed it so only takes it sporadically now. He reports he was taking an aspirin about every other day up until about 2 weeks ago. He does that because he has problems with chronic neck pain and headaches from an old fall injury. Patient has history of GI bleed\ulcer about 10 years ago. He reports he did not require blood transfusion at that time. He had a three-day hospitalization. He reports it occurred because he was taking [place] powders a lot. Patient is otherwise healthy. He does not have other chronic medical problems.  Closer to time of disposition, patient recalled that he has had his blood counts done by his outpatient provider within the past 2 months and he has consistently been at hemoglobin of 14. He also states that in September he took a course of steroids for an episode of bronchitis. Past Medical History:  Diagnosis Date  . Bronchitis 11/15/2012  . Chronic neck pain    s/p falling off scaffold 1997.  This is better now.  . Duodenal stricture 2011  . Esophageal stricture 2011  . GERD (gastroesophageal reflux disease)   . Hyperlipidemia   . Migraine headache   . Ulcer (HCC) 06/2010   NSAID-induced.  Duodenal bulb.  +GI bleed.    Patient Active Problem List   Diagnosis Date Noted  . Mitral  murmur 02/05/2016  . Plantar fasciitis of left foot 05/30/2013  . Head and face pain 03/26/2013  . Bronchitis 11/15/2012  . Dyspepsia 09/19/2012  . External hemorrhoid, thrombosed 09/19/2012  . Migraine headache without aura 09/19/2012  . Health maintenance examination 07/27/2012  . Onychomycosis 07/27/2012  . Insomnia 03/01/2012  . GAD (generalized anxiety disorder) 12/08/2011  . Hyperlipidemia 11/24/2011  . Fatigue 11/24/2011  . ANEMIA, SECONDARY TO ACUTE BLOOD LOSS 01/20/2011  . DUODENAL ULCER, ACUTE, HEMORRHAGE 01/20/2011    Past Surgical History:  Procedure Laterality Date  . ESOPHAGOGASTRODUODENOSCOPY         Home Medications    Prior to Admission medications   Medication Sig Start Date End Date Taking? Authorizing Provider  acetaminophen (TYLENOL) 500 MG tablet Take 1,000 mg by mouth every 6 (six) hours as needed. For pain   Yes Historical Provider, MD  aspirin EC 81 MG tablet Take 81 mg by mouth every 6 (six) hours as needed.   Yes Historical Provider, MD  buPROPion (WELLBUTRIN SR) 100 MG 12 hr tablet Take 100 mg by mouth 2 (two) times daily.   Yes Historical Provider, MD  cetirizine (ZYRTEC) 10 MG tablet Take 1 tablet (10 mg total) by mouth daily. 04/18/15  Yes Collie Siad English, PA  clonazePAM (KLONOPIN) 1 MG tablet Take 1-2 tablets (1-2 mg total) by mouth at bedtime as needed. 04/18/15  Yes Collie Siad English, PA  ferrous sulfate 325 (  65 FE) MG tablet Take 75 mg by mouth 2 (two) times daily with a meal.   Yes Historical Provider, MD  flunisolide (NASALIDE) 25 MCG/ACT (0.025%) SOLN Place 2 sprays into the nose 2 (two) times daily. 03/04/16  Yes Collie SiadStephanie D English, PA  omeprazole (PRILOSEC) 20 MG capsule Take 20 mg by mouth 2 (two) times daily. Reported on 12/17/2015   Yes Historical Provider, MD  Beclomethasone Dipropionate 80 MCG/ACT AERS Place 2 puffs into the nose daily. 04/18/15   Collie SiadStephanie D English, PA  citalopram (CELEXA) 40 MG tablet Take 0.5 tablets (20 mg  total) by mouth daily. Patient not taking: Reported on 04/18/2015 11/22/13   Jeoffrey MassedPhilip H McGowen, MD  ferrous sulfate 325 (65 FE) MG tablet Take 1 tablet (325 mg total) by mouth daily. 11/24/16   Arby BarretteMarcy Latoy Labriola, MD  FLUoxetine (PROZAC) 20 MG tablet Take 1 tablet (20 mg total) by mouth daily. Patient not taking: Reported on 10/23/2015 04/18/15   Collie SiadStephanie D English, PA  HYDROcodone-acetaminophen (NORCO) 5-325 MG tablet Take 1 tablet by mouth every 6 (six) hours as needed for moderate pain. Patient not taking: Reported on 12/11/2015 10/23/15   [name], PA-C  ivermectin (STROMECTOL) 3 MG TABS tablet Take 4 tablets (12 mg total) by mouth once. 01/12/16   Collie SiadStephanie D English, PA  omeprazole (PRILOSEC) 20 MG capsule Take 1 capsule (20 mg total) by mouth 2 (two) times daily before a meal. 11/24/16   Arby BarretteMarcy Andromeda Poppen, MD  oxyCODONE-acetaminophen (PERCOCET/ROXICET) 5-325 MG per tablet 1-2 tabs po q6h prn pain Patient not taking: Reported on 04/18/2015 07/27/13   Jeoffrey MassedPhilip H McGowen, MD  predniSONE (DELTASONE) 20 MG tablet Two daily with food Patient not taking: Reported on 12/17/2015 12/11/15   Elvina SidleKurt Lauenstein, MD  promethazine (PHENERGAN) 12.5 MG tablet Take 1 tablet (12.5 mg total) by mouth every 6 (six) hours as needed for nausea or vomiting. 10/23/15   [name], PA-C  promethazine (PHENERGAN) 25 MG tablet Take 1 tablet (25 mg total) by mouth every 8 (eight) hours as needed for nausea. 03/23/13   Jeoffrey MassedPhilip H McGowen, MD  pseudoephedrine-guaifenesin (MUCINEX D) 60-600 MG 12 hr tablet Take 1 tablet by mouth every 12 (twelve) hours. Patient not taking: Reported on 01/12/2016 12/17/15 12/16/16  Carmelina DaneJeffery S Anderson, MD  rizatriptan (MAXALT) 10 MG tablet Take 1 tablet (10 mg total) by mouth as needed for migraine. May repeat in 2 hours if needed 05/30/13   Jeoffrey MassedPhilip H McGowen, MD  sucralfate (CARAFATE) 1 GM/10ML suspension Take 10 mLs (1 g total) by mouth 4 (four) times daily -  with meals and at bedtime. 11/24/16    Arby BarretteMarcy Quame Spratlin, MD    Family History No family history on file.  Social History Social History  Substance Use Topics  . Smoking status: Never Smoker  . Smokeless tobacco: Never Used  . Alcohol use No     Allergies   Patient has no known allergies.   Review of Systems Review of Systems 10 Systems reviewed and are negative for acute change except as noted in the HPI.  Physical Exam Updated Vital Signs BP 126/68   Pulse 102   Temp 98.1 F (36.7 C)   Resp 17   Ht 5\' 8"  (1.727 m)   Wt 170 lb (77.1 kg)   SpO2 100%   BMI 25.85 kg/m   Physical Exam  Constitutional: He is oriented to person, place, and time. He appears well-developed and well-nourished.  HENT:  Head: Normocephalic and atraumatic.  Eyes: Conjunctivae and EOM are normal.  Neck: Neck supple.  Cardiovascular: Normal rate, regular rhythm, normal heart sounds and intact distal pulses.   No murmur heard. Pulmonary/Chest: Effort normal and breath sounds normal. No respiratory distress.  Abdominal: Soft. He exhibits no distension. There is no tenderness. There is no guarding.  Genitourinary:  Genitourinary Comments: Trace dark greenish brown stool in the vault. No melena.  Musculoskeletal: Normal range of motion. He exhibits no edema, tenderness or deformity.  Neurological: He is alert and oriented to person, place, and time. He exhibits normal muscle tone. Coordination normal.  Skin: Skin is warm and dry.  Psychiatric: He has a normal mood and affect.  Nursing note and vitals reviewed.    ED Treatments / Results  Labs (all labs ordered are listed, but only abnormal results are displayed) Labs Reviewed  COMPREHENSIVE METABOLIC PANEL - Abnormal; Notable for the following:       Result Value   BUN 43 (*)    Calcium 8.7 (*)    Total Protein 6.2 (*)    All other components within normal limits  CBC - Abnormal; Notable for the following:    RDW 18.7 (*)    All other components within normal limits    URINALYSIS, ROUTINE W REFLEX MICROSCOPIC (NOT AT [facility]) - Abnormal; Notable for the following:    Ketones, ur 15 (*)    All other components within normal limits  OCCULT BLOOD X 1 CARD TO LAB, STOOL - Abnormal; Notable for the following:    Fecal Occult Bld POSITIVE (*)    All other components within normal limits  LIPASE, BLOOD  PROTIME-INR    EKG  EKG Interpretation None       Radiology No results found.  Procedures Procedures (including critical care time)  Medications Ordered in ED Medications  0.9 %  sodium chloride infusion ( Intravenous New Bag/Given 11/24/16 0933)  sucralfate (CARAFATE) tablet 1 g (not administered)  famotidine (PEPCID) tablet 40 mg (not administered)  sodium chloride 0.9 % bolus 1,000 mL (0 mLs Intravenous Stopped 11/24/16 0931)  pantoprazole (PROTONIX) injection 40 mg (40 mg Intravenous Given 11/24/16 0826)  ondansetron (ZOFRAN) injection 4 mg (4 mg Intravenous Given 11/24/16 0826)     Initial Impression / Assessment and Plan / ED Course  I have reviewed the triage vital signs and the nursing notes.  Pertinent labs & imaging results that were available during my care of the patient were reviewed by me and considered in my medical decision making (see chart for details).  Clinical Course     Final Clinical Impressions(s) / ED Diagnoses   Final diagnoses:  Upper GI bleed   Patient presents with symptoms consistent with GI bleed. He has history of similar due to duodenal ulcer. That was precipitated by use of aspirin. Patient had again been taking some aspirin and had discontinued his omeprazole due to not having symptoms. Patient's hemoglobin is stable by his report of outpatient hemoglobin of 14 checked several times over the past 2 months. Initial plan was for observation admission, patient would prefer discharge. I do believe this has been incremental with elevated BUN and acutely the patient does not appear to be bleeding. His stool is not  melanotic and he has had no emesis. Heart rate is remained very stable without tachycardia. Plan will be to discontinue all NSAIDs and aspirin, initiate omeprazole twice daily and Carafate and follow up with GI. Patient is very aware of signs and symptoms for which to  return to the emergency department. New Prescriptions New Prescriptions   FERROUS SULFATE 325 (65 FE) MG TABLET    Take 1 tablet (325 mg total) by mouth daily.   OMEPRAZOLE (PRILOSEC) 20 MG CAPSULE    Take 1 capsule (20 mg total) by mouth 2 (two) times daily before a meal.   SUCRALFATE (CARAFATE) 1 GM/10ML SUSPENSION    Take 10 mLs (1 g total) by mouth 4 (four) times daily -  with meals and at bedtime.     Arby Barrette, MD 11/24/16 1003

## 2016-12-01 ENCOUNTER — Ambulatory Visit: Payer: 59 | Admitting: Physician Assistant

## 2019-02-16 ENCOUNTER — Encounter: Payer: Self-pay | Admitting: Gastroenterology

## 2022-12-09 NOTE — Progress Notes (Signed)
This encounter was created in error - please disregard.
